# Patient Record
Sex: Female | Born: 1995 | Race: Black or African American | Hispanic: No | Marital: Single | Smoking: Current every day smoker
Health system: Southern US, Community
[De-identification: ages and names within clinical notes are randomized; demographics above are authoritative.]

## PROBLEM LIST (undated history)

## (undated) ENCOUNTER — Inpatient Hospital Stay (HOSPITAL_COMMUNITY): Payer: Self-pay

## (undated) ENCOUNTER — Emergency Department (HOSPITAL_BASED_OUTPATIENT_CLINIC_OR_DEPARTMENT_OTHER): Admission: EM | Payer: Medicaid Other | Source: Home / Self Care

---
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
## 2018-01-15 ENCOUNTER — Encounter (HOSPITAL_COMMUNITY): Payer: Self-pay | Admitting: *Deleted

## 2018-01-15 ENCOUNTER — Inpatient Hospital Stay (HOSPITAL_COMMUNITY)
Admission: AD | Admit: 2018-01-15 | Discharge: 2018-01-16 | Disposition: A | Discharge status: Home / Self Care | Payer: BLUE CROSS/BLUE SHIELD | Source: Ambulatory Visit | Attending: Obstetrics & Gynecology | Admitting: Obstetrics & Gynecology

## 2018-01-15 DIAGNOSIS — N926 Irregular menstruation, unspecified: Secondary | ICD-10-CM | POA: Diagnosis not present

## 2018-01-15 DIAGNOSIS — N912 Amenorrhea, unspecified: Secondary | ICD-10-CM | POA: Insufficient documentation

## 2018-01-15 DIAGNOSIS — Z3202 Encounter for pregnancy test, result negative: Secondary | ICD-10-CM | POA: Insufficient documentation

## 2018-01-15 DIAGNOSIS — R109 Unspecified abdominal pain: Secondary | ICD-10-CM | POA: Diagnosis not present

## 2018-01-15 DIAGNOSIS — O26891 Other specified pregnancy related conditions, first trimester: Secondary | ICD-10-CM | POA: Diagnosis not present

## 2018-01-15 DIAGNOSIS — R112 Nausea with vomiting, unspecified: Secondary | ICD-10-CM

## 2018-01-15 DIAGNOSIS — R102 Pelvic and perineal pain: Secondary | ICD-10-CM

## 2018-01-15 DIAGNOSIS — O26899 Other specified pregnancy related conditions, unspecified trimester: Secondary | ICD-10-CM

## 2018-01-15 LAB — URINALYSIS, ROUTINE W REFLEX MICROSCOPIC
Bilirubin Urine: NEGATIVE
Glucose, UA: NEGATIVE mg/dL
Hgb urine dipstick: NEGATIVE
Ketones, ur: 5 mg/dL — AB
Leukocytes, UA: NEGATIVE
Nitrite: NEGATIVE
Protein, ur: NEGATIVE mg/dL
Specific Gravity, Urine: 1.03 (ref 1.005–1.030)
pH: 6 (ref 5.0–8.0)

## 2018-01-15 LAB — CBC
HCT: 42.7 % (ref 36.0–46.0)
Hemoglobin: 14.6 g/dL (ref 12.0–15.0)
MCH: 29.7 pg (ref 26.0–34.0)
MCHC: 34.2 g/dL (ref 30.0–36.0)
MCV: 86.8 fL (ref 78.0–100.0)
Platelets: 273 10*3/uL (ref 150–400)
RBC: 4.92 MIL/uL (ref 3.87–5.11)
RDW: 14.6 % (ref 11.5–15.5)
WBC: 8.4 10*3/uL (ref 4.0–10.5)

## 2018-01-15 LAB — POCT PREGNANCY, URINE: Preg Test, Ur: POSITIVE — AB

## 2018-01-15 NOTE — MAU Note (Signed)
PT SAYS SHE HAD VAG BLEEDING 2 WEEKS AGO.     BEFORE THIS  NO CYCLE X3-4 MTHS.  HPT WERE NEG. - NO MEDS.     NO BLEEDING NOW- FEELS HEAVY CRAMPS. - NO  HPT.      NO BIRTH CONTROL.   LAST SEX-   3-2

## 2018-01-16 ENCOUNTER — Encounter: Payer: Self-pay | Admitting: Advanced Practice Midwife

## 2018-01-16 ENCOUNTER — Inpatient Hospital Stay (HOSPITAL_COMMUNITY): Payer: BLUE CROSS/BLUE SHIELD

## 2018-01-16 ENCOUNTER — Telehealth (HOSPITAL_COMMUNITY): Payer: Self-pay | Admitting: *Deleted

## 2018-01-16 DIAGNOSIS — O26891 Other specified pregnancy related conditions, first trimester: Secondary | ICD-10-CM

## 2018-01-16 DIAGNOSIS — R109 Unspecified abdominal pain: Secondary | ICD-10-CM

## 2018-01-16 LAB — WET PREP, GENITAL
CLUE CELLS WET PREP: NONE SEEN
Sperm: NONE SEEN
Trich, Wet Prep: NONE SEEN
Yeast Wet Prep HPF POC: NONE SEEN

## 2018-01-16 LAB — HCG, QUANTITATIVE, PREGNANCY

## 2018-01-16 LAB — HIV ANTIBODY (ROUTINE TESTING W REFLEX): HIV SCREEN 4TH GENERATION: NONREACTIVE

## 2018-01-16 MED ORDER — ONDANSETRON 4 MG PO TBDP: 4.0000 mg | ORAL_TABLET | Freq: Three times a day (TID) | ORAL | Status: DC | PRN

## 2018-01-16 NOTE — Discharge Instructions (Signed)
Nausea and Vomiting, Adult Feeling sick to your stomach (nausea) means that your stomach is upset or you feel like you have to throw up (vomit). Feeling more and more sick to your stomach can lead to throwing up. Throwing up happens when food and liquid from your stomach are thrown up and out the mouth. Throwing up can make you feel weak and cause you to get dehydrated. Dehydration can make you tired and thirsty, make you have a dry mouth, and make it so you pee (urinate) less often. Older adults and people with other diseases or a weak defense system (immune system) are at higher risk for dehydration. If you feel sick to your stomach or if you throw up, it is important to follow instructions from your doctor about how to take care of yourself. Follow these instructions at home: Eating and drinking Follow these instructions as told by your doctor:  Take an oral rehydration solution (ORS). This is a drink that is sold at pharmacies and stores.  Drink clear fluids in small amounts as you are able, such as: ? Water. ? Ice chips. ? Diluted fruit juice. ? Low-calorie sports drinks.  Eat bland, easy-to-digest foods in small amounts as you are able, such as: ? Bananas. ? Applesauce. ? Rice. ? Low-fat (lean) meats. ? Toast. ? Crackers.  Avoid fluids that have a lot of sugar or caffeine in them.  Avoid alcohol.  Avoid spicy or fatty foods.  General instructions  Drink enough fluid to keep your pee (urine) clear or pale yellow.  Wash your hands often. If you cannot use soap and water, use hand sanitizer.  Make sure that all people in your home wash their hands well and often.  Take over-the-counter and prescription medicines only as told by your doctor.  Rest at home while you get better.  Watch your condition for any changes.  Breathe slowly and deeply when you feel sick to your stomach.  Keep all follow-up visits as told by your doctor. This is important. Contact a doctor  if:  You have a fever.  You cannot keep fluids down.  Your symptoms get worse.  You have new symptoms.  You feel sick to your stomach for more than two days.  You feel light-headed or dizzy.  You have a headache.  You have muscle cramps. Get help right away if:  You have pain in your chest, neck, arm, or jaw.  You feel very weak or you pass out (faint).  You throw up again and again.  You see blood in your throw-up.  Your throw-up looks like black coffee grounds.  You have bloody or black poop (stools) or poop that look like tar.  You have a very bad headache, a stiff neck, or both.  You have a rash.  You have very bad pain, cramping, or bloating in your belly (abdomen).  You have trouble breathing.  You are breathing very quickly.  Your heart is beating very quickly.  Your skin feels cold and clammy.  You feel confused.  You have pain when you pee.  You have signs of dehydration, such as: ? Dark pee, hardly any pee, or no pee. ? Cracked lips. ? Dry mouth. ? Sunken eyes. ? Sleepiness. ? Weakness. These symptoms may be an emergency. Do not wait to see if the symptoms will go away. Get medical help right away. Call your local emergency services (911 in the [place] Do not drive yourself to the hospital. This information is  not intended to replace advice given to you by your health care provider. Make sure you discuss any questions you have with your health care provider. Document Released: 04/15/2008 Document Revised: 05/17/2016 Document Reviewed: 07/04/2015 Elsevier Interactive Patient Education  2018 [employer] Contraception Choices Contraception, also called birth control, means things to use or ways to try not to get pregnant. Hormonal birth control This kind of birth control uses hormones. Here are some types of hormonal birth control:  A tube that is put under skin of the arm (implant). The tube can stay in for as long as 3 years.  Shots to get  every 3 months (injections).  Pills to take every day (birth control pills).  A patch to change 1 time each week for 3 weeks (birth control patch). After that, the patch is taken off for 1 week.  A ring to put in the vagina. The ring is left in for 3 weeks. Then it is taken out of the vagina for 1 week. Then a new ring is put in.  Pills to take after unprotected sex (emergency birth control pills).  Barrier birth control Here are some types of barrier birth control:  A thin covering that is put on the penis before sex (female condom). The covering is thrown away after sex.  A soft, loose covering that is put in the vagina before sex (female condom). The covering is thrown away after sex.  A rubber bowl that sits over the cervix (diaphragm). The bowl must be made for you. The bowl is put into the vagina before sex. The bowl is left in for 6-8 hours after sex. It is taken out within 24 hours.  A small, soft cup that fits over the cervix (cervical cap). The cup must be made for you. The cup can be left in for 6-8 hours after sex. It is taken out within 48 hours.  A sponge that is put into the vagina before sex. It must be left in for at least 6 hours after sex. It must be taken out within 30 hours. Then it is thrown away.  A chemical that kills or stops sperm from getting into the uterus (spermicide). It may be a pill, cream, jelly, or foam to put in the vagina. The chemical should be used at least 10-15 minutes before sex.  IUD (intrauterine) birth control An IUD is a small, T-shaped piece of plastic. It is put inside the uterus. There are two kinds:  Hormone IUD. This kind can stay in for 3-5 years.  Copper IUD. This kind can stay in for 10 years.  Permanent birth control Here are some types of permanent birth control:  Surgery to block the fallopian tubes.  Having an insert put into each fallopian tube.  Surgery to tie off the tubes that carry sperm (vasectomy).  Natural  planning birth control Here are some types of natural planning birth control:  Not having sex on the days the woman could get pregnant.  Using a calendar: ? To keep track of the length of each period. ? To find out what days pregnancy can happen. ? To plan to not have sex on days when pregnancy can happen.  Watching for symptoms of ovulation and not having sex during ovulation. One way the woman can check for ovulation is to check her temperature.  Waiting to have sex until after ovulation.  Summary  Contraception, also called birth control, means things to use or ways to try not to  get pregnant.  Hormonal methods of birth control include implants, injections, pills, patches, vaginal rings, and emergency birth control pills.  Barrier methods of birth control can include female condoms, female condoms, diaphragms, cervical caps, sponges, and spermicides.  There are two types of IUD (intrauterine device) birth control. An IUD can be put in a woman's uterus to prevent pregnancy for 3-5 years.  Permanent sterilization can be done through a procedure for males, females, or both.  Natural planning methods involve not having sex on the days when the woman could get pregnant. This information is not intended to replace advice given to you by your health care provider. Make sure you discuss any questions you have with your health care provider. Document Released: 08/25/2009 Document Revised: 11/07/2016 Document Reviewed: 11/07/2016 Elsevier Interactive Patient Education  2017 [employer]. Safe Sex Practicing safe sex means taking steps before and during sex to reduce your risk of:  Getting an STD (sexually transmitted disease).  Giving your partner an STD.  Unwanted pregnancy.  How can I practice safe sex?  To practice safe sex:  Limit your sexual partners to only one partner who is having sex with only you.  Avoid using alcohol and recreational drugs before having sex. These  substances can affect your judgment.  Before having sex with a new partner: ? Talk to your partner about past partners, past STDs, and drug use. ? You and your partner should be screened for STDs and discuss the results with each other.  Check your body regularly for sores, blisters, rashes, or unusual discharge. If you notice any of these problems, visit your health care provider.  If you have symptoms of an infection or you are being treated for an STD, avoid sexual contact.  While having sex, use a condom. Make sure to: ? Use a condom every time you have vaginal, oral, or anal sex. Both females and males should wear condoms during oral sex. ? Keep condoms in place from the beginning to the end of sexual activity. ? Use a latex condom, if possible. Latex condoms offer the best protection. ? Use only water-based lubricants or oils to lubricate a condom. Using petroleum-based lubricants or oils will weaken the condom and increase the chance that it will break.  See your health care provider for regular screenings, exams, and tests for STDs.  Talk with your health care provider about the form of birth control (contraception) that is best for you.  Get vaccinated against hepatitis B and human papillomavirus (HPV).  If you are at risk of being infected with HIV (human immunodeficiency virus), talk with your health care provider about taking a prescription medicine to prevent HIV infection. You are considered at risk for HIV if: ? You are a man who has sex with other men. ? You are a heterosexual man or woman who is sexually active with more than one partner. ? You take drugs by injection. ? You are sexually active with a partner who has HIV.  This information is not intended to replace advice given to you by your health care provider. Make sure you discuss any questions you have with your health care provider. Document Released: 12/05/2004 Document Revised: 03/13/2016 Document Reviewed:  09/17/2015 Elsevier Interactive Patient Education  2018 [employer].  Prenatal Care Providers [facility]    [facility]  [phone]     Phone: [phone]          [facility]  [facility]  [email or web address]  [employer]: [phone]  Phone: [phone]         [facility]     Phone: [phone]  Phone: [phone]           [facility] [place]                hone: [phone]  Phone: [phone]         [facility] [address]            Phone: [phone]  Phone:          [facility] Dept[facility]                Phone: [phone]  [facility]   [address] [place])          Phone: [phone] [facility]   Phone: [phone]

## 2018-01-16 NOTE — MAU Provider Note (Signed)
Chief Complaint: Abdominal Pain   First Provider Initiated Contact with Patient 01/16/18 0007        SUBJECTIVE HPI: Laura Lynch is a 22 y.o. G0P0000 who presents to maternity admissions reporting pelvic cramping and irregular bleeding.  Bled 2 weeks ago which was after missing period for 2 months.  States only had sex one time 2 weeks ago.. She denies vaginal bleeding, vaginal itching/burning, urinary symptoms, h/a, dizziness, n/v, or fever/chills.    Abdominal Pain  This is a new problem. The current episode started in the past 7 days. The onset quality is gradual. The problem occurs intermittently. The problem has been unchanged. The pain is located in the suprapubic region, LLQ and RLQ. The pain is mild. The quality of the pain is cramping. The abdominal pain does not radiate. Pertinent negatives include no constipation, diarrhea, dysuria or fever. Nothing aggravates the pain. The pain is relieved by nothing. She has tried nothing for the symptoms.    RN Note: PT SAYS SHE HAD VAG BLEEDING 2 WEEKS AGO.     BEFORE THIS  NO CYCLE X3-4 MTHS.  HPT WERE NEG. - NO MEDS.     NO BLEEDING NOW- FEELS HEAVY CRAMPS. - NO  HPT.      NO BIRTH CONTROL.   LAST SEX-   3-2    Social History   Socioeconomic History  . Marital status: Single    Spouse name: Not on file  . Number of children: Not on file  . Years of education: Not on file  . Highest education level: Not on file  Social Needs  . Financial resource strain: Not on file  . Food insecurity - worry: Not on file  . Food insecurity - inability: Not on file  . Transportation needs - medical: Not on file  . Transportation needs - non-medical: Not on file  Occupational History  . Not on file  Tobacco Use  . Smoking status: Not on file  Substance and Sexual Activity  . Alcohol use: Not on file  . Drug use: Not on file  . Sexual activity: Not on file  Other Topics Concern  . Not on file  Social History Narrative  . Not on file   No  current facility-administered medications on file prior to encounter.    No current outpatient medications on file prior to encounter.   Allergies not on file  I have reviewed patient's Past Medical Hx, Surgical Hx, Family Hx, Social Hx, medications and allergies.   ROS:  Review of Systems  Constitutional: Negative for fever.  Gastrointestinal: Positive for abdominal pain. Negative for constipation and diarrhea.  Genitourinary: Negative for dysuria.   Review of Systems  Other systems negative   Physical Exam  Physical Exam Patient Vitals for the past 24 hrs:  BP Temp Temp src Pulse Resp Height Weight  01/15/18 2214 128/82 98.8 F (37.1 C) Oral 85 20 5\' 6"  (1.676 m) 258 lb (117 kg)   Constitutional: Well-developed, well-nourished female in no acute distress.  Cardiovascular: normal rate Respiratory: normal effort GI: Abd soft, non-tender. Pos BS x 4 MS: Extremities nontender, no edema, normal ROM Neurologic: Alert and oriented x 4.  GU: Neg CVAT.  PELVIC EXAM: Minimal white discharge. NO bleeding.  Self-collected swabs.  Declined pelvic exam after finding out pregnancy test was negative.   LAB RESULTS Results for orders placed or performed during the hospital encounter of 01/15/18 (from the past 24 hour(s))  Urinalysis, Routine w reflex microscopic  Status: Abnormal   Collection Time: 01/15/18 10:17 PM  Result Value Ref Range   Color, Urine YELLOW YELLOW   APPearance CLEAR CLEAR   Specific Gravity, Urine 1.030 1.005 - 1.030   pH 6.0 5.0 - 8.0   Glucose, UA NEGATIVE NEGATIVE mg/dL   Hgb urine dipstick NEGATIVE NEGATIVE   Bilirubin Urine NEGATIVE NEGATIVE   Ketones, ur 5 (A) NEGATIVE mg/dL   Protein, ur NEGATIVE NEGATIVE mg/dL   Nitrite NEGATIVE NEGATIVE   Leukocytes, UA NEGATIVE NEGATIVE  Pregnancy, urine POC     Status: Abnormal   Collection Time: 01/15/18 10:26 PM  Result Value Ref Range   Preg Test, Ur POSITIVE (A) NEGATIVE  CBC     Status: None    Collection Time: 01/15/18 11:12 PM  Result Value Ref Range   WBC 8.4 4.0 - 10.5 K/uL   RBC 4.92 3.87 - 5.11 MIL/uL   Hemoglobin 14.6 12.0 - 15.0 g/dL   HCT 16.1 09.6 - 04.5 %   MCV 86.8 78.0 - 100.0 fL   MCH 29.7 26.0 - 34.0 pg   MCHC 34.2 30.0 - 36.0 g/dL   RDW 40.9 81.1 - 91.4 %   Platelets 273 150 - 400 K/uL    Ref. Range 01/15/2018 23:12  HCG, Beta Chain, Quant, S Latest Ref Range: <5 mIU/mL <1      IMAGING No results found.  MAU Management/MDM: Ordered usual first trimester r/o ectopic labs.   Pelvic exam and cultures done Quant HCG is negative, despite positive urine pregnancy test  Patient was inconsolable when told UPT was positive Ecstatic that blood test is negative.  Long discussion of need for contraception  Does not have a GYN, list provided    ASSESSMENT Irregular menstrual bleeding pattern Unexplained amenorrhea Negative pregnancy test  PLAN Discharge home Followup with GYN for contraceptive counseling.  Pt stable at time of discharge. Encouraged to return here or to other Urgent Care/ED if she develops worsening of symptoms, increase in pain, fever, or other concerning symptoms.    Wynelle Bourgeois CNM, MSN Certified Nurse-Midwife 01/16/2018  12:07 AM

## 2018-01-17 LAB — GC/CHLAMYDIA PROBE AMP (~~LOC~~) NOT AT ARMC
Chlamydia: NEGATIVE
Neisseria Gonorrhea: NEGATIVE

## 2018-04-30 ENCOUNTER — Ambulatory Visit: Payer: Self-pay | Admitting: Clinical

## 2018-04-30 ENCOUNTER — Ambulatory Visit (INDEPENDENT_AMBULATORY_CARE_PROVIDER_SITE_OTHER): Payer: BLUE CROSS/BLUE SHIELD | Admitting: Obstetrics & Gynecology

## 2018-04-30 ENCOUNTER — Encounter: Payer: Self-pay | Admitting: Obstetrics & Gynecology

## 2018-04-30 VITALS — BP 139/67 | HR 56 | Ht 67.0 in | Wt 237.0 lb

## 2018-04-30 DIAGNOSIS — Z124 Encounter for screening for malignant neoplasm of cervix: Secondary | ICD-10-CM

## 2018-04-30 DIAGNOSIS — Z113 Encounter for screening for infections with a predominantly sexual mode of transmission: Secondary | ICD-10-CM | POA: Diagnosis not present

## 2018-04-30 DIAGNOSIS — N938 Other specified abnormal uterine and vaginal bleeding: Secondary | ICD-10-CM | POA: Diagnosis not present

## 2018-04-30 DIAGNOSIS — Z0000 Encounter for general adult medical examination without abnormal findings: Secondary | ICD-10-CM

## 2018-04-30 DIAGNOSIS — F419 Anxiety disorder, unspecified: Secondary | ICD-10-CM

## 2018-04-30 DIAGNOSIS — F4322 Adjustment disorder with anxiety: Secondary | ICD-10-CM

## 2018-04-30 MED ORDER — MEGESTROL ACETATE 40 MG PO TABS: 40.0000 mg | ORAL_TABLET | Freq: Two times a day (BID) | ORAL | 5 refills | Status: DC

## 2018-04-30 MED ORDER — NORGESTREL-ETHINYL ESTRADIOL 0.3-30 MG-MCG PO TABS: 1.0000 | ORAL_TABLET | Freq: Every day | ORAL | 11 refills | Status: AC

## 2018-04-30 NOTE — Progress Notes (Signed)
   Subjective:    Patient ID: Laura Lynch, female    DOB: 04-29-1996, 22 y.o.   MRN: [id number]  HPI  22 yo G0 here with 3 months of irregular bleeding. She was seen in the MAU 3/19 with this problem. A UPT was + so an u/s was done looking for a pregnancy. There was none. Her QBHCG was negative. She does not use contraception. She is "talking to" a fellow now, but hasn't sex for 4 months.  Review of Systems Her hbg was 14 in March 2019.    Objective:   Physical Exam Breathing, conversing, and ambulating normally Well nourished, well hydrated Black female, no apparent distress Abd- benign, morbidly obese Normal appearing cervix     Assessment & Plan:  DUB- check gyn u/s, TSH Start Lo ovral today Use back up method for 2 weeks Preventative care- pap smear with cotesting done today BP check in 2 weeks She will see Laura Lynch today Follow up 3 months

## 2018-04-30 NOTE — BH Specialist Note (Signed)
Integrated Behavioral Health Initial Visit  MRN: [id number] Name: Laura BruntKeera E Zentz  Number of Integrated Behavioral Health Clinician visits:: 1/6 Session Start time: 4:00  Session End time: 4:15 Total time: 15 minutes  Type of Service: Integrated Behavioral Health- Individual/Family Interpretor:No. Interpretor Name and Language: n/a   Warm Hand Off Completed.       SUBJECTIVE: Laura Lynch is a 22 y.o. female accompanied by n/a Patient was referred by Dr Marice Potterove for anxiety and depression. Patient reports the following symptoms/concerns: Pt states her primary concern today is fatigue, lack of appetite, restlessness, irritability, dread, and lack of quality sleep attributed to life stress ; pt open to self-coping strategies and education today, for herself and friends.  Duration of problem: Three months; Severity of problem: moderate  OBJECTIVE: Mood: Anxious and Affect: Appropriate and Tearful Risk of harm to self or others: No plan to harm self or others  LIFE CONTEXT: Family and Social: Pt lives by herself; supportive friends School/Work: - Self-Care: Recognizing greater need for self-care post-breakup Life Changes: Excessive bleeding; breakup of romantic relationship   GOALS ADDRESSED: Patient will: 1. Reduce symptoms of: agitation, anxiety, depression, insomnia and stress 2. Increase knowledge and/or ability of: self-management skills  3. Demonstrate ability to: Increase healthy adjustment to current life circumstances  INTERVENTIONS: Interventions utilized: Mindfulness or Management consultantelaxation Training, Sleep Hygiene and Psychoeducation and/or Health Education  Standardized Assessments completed: GAD-7 and PHQ 9  ASSESSMENT: Patient currently experiencing Adjustment disorder with anxious mood.   Patient may benefit from psychoeducation and brief therapeutic interventions regarding coping with symptoms of anxiety .  PLAN: 1. Follow up with behavioral health clinician on : As  needed 2. Behavioral recommendations:  -CALM relaxation breathing exercise every morning and at bedtime; increase calming sleep sounds to every night, for as long as remains helpful -Read educational materials regarding coping with symptoms of depression and anxiety; share materials with friends experiencing symptoms  -Share community mental health resources with friends  3. Referral(s): [facility] 4. "From scale of 1-10, how likely are you to follow plan?": 10  Rae LipsJamie C Lunden Stieber, LCSW  Dep[facility] screen [facility] 04/30/2018  Decreased Interest 0  Down, Depressed, Hopeless 0  PHQ - 2 Score 0  Altered sleeping 3  Tired, decreased energy 2  Change in appetite 3  Feeling bad or failure about yourself  1  Trouble concentrating 3  Moving slowly or fidgety/restless 3  Suicidal thoughts 0  PHQ-9 Score 15   GAD 7 : Generalized Anxiety Score 04/30/2018  Nervous, Anxious, on Edge 0  Control/stop worrying 1  Worry too much - different things 0  Trouble relaxing 3  Restless 3  Easily annoyed or irritable 3  Afraid - awful might happen 3  Total GAD 7 Score 13

## 2018-04-30 NOTE — Progress Notes (Signed)
Patient had elevated phq9 & gad7 today- will see Asher MuirJamie. Dr Marice Potterove aware.

## 2018-05-01 LAB — TSH: TSH: 1.51 u[IU]/mL (ref 0.450–4.500)

## 2018-05-01 LAB — CYTOLOGY - PAP
Chlamydia: NEGATIVE
Diagnosis: NEGATIVE
Neisseria Gonorrhea: NEGATIVE

## 2018-05-29 ENCOUNTER — Encounter: Payer: Self-pay | Admitting: Advanced Practice Midwife

## 2018-05-29 ENCOUNTER — Ambulatory Visit (INDEPENDENT_AMBULATORY_CARE_PROVIDER_SITE_OTHER): Payer: BLUE CROSS/BLUE SHIELD | Admitting: Advanced Practice Midwife

## 2018-05-29 ENCOUNTER — Emergency Department (HOSPITAL_COMMUNITY)
Admission: EM | Admit: 2018-05-29 | Discharge: 2018-05-29 | Disposition: A | Discharge status: Home / Self Care | Payer: BLUE CROSS/BLUE SHIELD | Attending: Emergency Medicine | Admitting: Emergency Medicine

## 2018-05-29 ENCOUNTER — Ambulatory Visit: Payer: BLUE CROSS/BLUE SHIELD | Admitting: Obstetrics & Gynecology

## 2018-05-29 ENCOUNTER — Other Ambulatory Visit: Payer: Self-pay

## 2018-05-29 VITALS — BP 126/70 | HR 80

## 2018-05-29 DIAGNOSIS — Z79899 Other long term (current) drug therapy: Secondary | ICD-10-CM | POA: Insufficient documentation

## 2018-05-29 DIAGNOSIS — F17200 Nicotine dependence, unspecified, uncomplicated: Secondary | ICD-10-CM | POA: Insufficient documentation

## 2018-05-29 DIAGNOSIS — R109 Unspecified abdominal pain: Secondary | ICD-10-CM | POA: Insufficient documentation

## 2018-05-29 DIAGNOSIS — Z113 Encounter for screening for infections with a predominantly sexual mode of transmission: Secondary | ICD-10-CM | POA: Diagnosis not present

## 2018-05-29 DIAGNOSIS — N938 Other specified abnormal uterine and vaginal bleeding: Secondary | ICD-10-CM | POA: Diagnosis not present

## 2018-05-29 DIAGNOSIS — F1210 Cannabis abuse, uncomplicated: Secondary | ICD-10-CM | POA: Diagnosis not present

## 2018-05-29 DIAGNOSIS — N921 Excessive and frequent menstruation with irregular cycle: Secondary | ICD-10-CM | POA: Diagnosis not present

## 2018-05-29 DIAGNOSIS — N939 Abnormal uterine and vaginal bleeding, unspecified: Secondary | ICD-10-CM | POA: Diagnosis present

## 2018-05-29 LAB — CBC
HCT: 41.5 % (ref 36.0–46.0)
Hemoglobin: 14.3 g/dL (ref 12.0–15.0)
MCH: 30.7 pg (ref 26.0–34.0)
MCHC: 34.5 g/dL (ref 30.0–36.0)
MCV: 89.1 fL (ref 78.0–100.0)
PLATELETS: 313 10*3/uL (ref 150–400)
RBC: 4.66 MIL/uL (ref 3.87–5.11)
RDW: 13.8 % (ref 11.5–15.5)
WBC: 9.1 10*3/uL (ref 4.0–10.5)

## 2018-05-29 LAB — COMPREHENSIVE METABOLIC PANEL
ALT: 15 U/L (ref 0–44)
AST: 13 U/L — ABNORMAL LOW (ref 15–41)
Albumin: 3.8 g/dL (ref 3.5–5.0)
Alkaline Phosphatase: 66 U/L (ref 38–126)
Anion gap: 9 (ref 5–15)
BUN: 11 mg/dL (ref 6–20)
CHLORIDE: 108 mmol/L (ref 98–111)
CO2: 22 mmol/L (ref 22–32)
CREATININE: 0.85 mg/dL (ref 0.44–1.00)
Calcium: 9 mg/dL (ref 8.9–10.3)
Glucose, Bld: 121 mg/dL — ABNORMAL HIGH (ref 70–99)
POTASSIUM: 3.7 mmol/L (ref 3.5–5.1)
SODIUM: 139 mmol/L (ref 135–145)
Total Bilirubin: 0.3 mg/dL (ref 0.3–1.2)
Total Protein: 7.5 g/dL (ref 6.5–8.1)

## 2018-05-29 LAB — URINALYSIS, ROUTINE W REFLEX MICROSCOPIC
Bilirubin Urine: NEGATIVE
GLUCOSE, UA: NEGATIVE mg/dL
Ketones, ur: 5 mg/dL — AB
Leukocytes, UA: NEGATIVE
Nitrite: NEGATIVE
Protein, ur: NEGATIVE mg/dL
SPECIFIC GRAVITY, URINE: 1.024 (ref 1.005–1.030)
pH: 5 (ref 5.0–8.0)

## 2018-05-29 LAB — POC URINE PREG, ED: PREG TEST UR: NEGATIVE

## 2018-05-29 LAB — I-STAT BETA HCG BLOOD, ED (MC, WL, AP ONLY): I-stat hCG, quantitative: <5 m[IU]/mL (ref <5)

## 2018-05-29 LAB — LIPASE, BLOOD: LIPASE: 26 U/L (ref 11–51)

## 2018-05-29 NOTE — Progress Notes (Signed)
Per Dr. Dove, pt does not need to be contacted in regards to missed appt.    

## 2018-05-29 NOTE — ED Triage Notes (Signed)
Per Pt: Pt reports she is having severe stabbing abdominal pain that has been going on for 3 days. Pt reports heavy bleeding through her tampons with lots of clots. Pt reports she woke up at 5am due to the pain.  Pt denies a hx of fibroids, but her mom has fibroids.

## 2018-05-29 NOTE — Discharge Instructions (Signed)
Go to womens clinic now

## 2018-05-29 NOTE — ED Provider Notes (Signed)
[facility]-EMERGENCY DEPT Provider Note   CSN: [id number] Arrival date & time: 05/29/18  1319     History   Chief Complaint Chief Complaint  Patient presents with  . Abdominal Pain  . Vaginal Bleeding    HPI Laura Lynch is a 22 y.o. female.  HPI Patient is a 22 year old female presents the emergency department for ongoing vaginal bleeding and history of dysfunctional uterine bleeding over the past 3 months.  She was recently started on a course of contraceptive medication by her GYN clinic and she reports this is not helped her vaginal bleeding.  She presents with ongoing crampy abdominal pain and persistent heavy vaginal bleeding without lightheadedness or syncope.  She was supposed to follow-up in the gynecology clinic this morning but she overslept.  Her appointment was at 830 this morning.  She is not sexually active.  Patient has not had an ultrasound at this point.  No past medical history on file.  Patient Active Problem List   Diagnosis Date Noted  . Morbid obesity (HCC) 04/30/2018  . DUB (dysfunctional uterine bleeding) 04/30/2018    ** The histories are not reviewed yet. Please review them in the "History" navigator section and refresh this SmartLink.   OB History    Gravida  1   Para  0   Term  0   Preterm  0   AB  0   Living  0     SAB  0   TAB  0   Ectopic  0   Multiple  0   Live Births  0            Home Medications    Prior to Admission medications   Medication Sig Start Date End Date Taking? Authorizing Provider  megestrol (MEGACE) 40 MG tablet Take 40 mg by mouth 2 (two) times daily. 05/01/18  Yes [provider]  norgestrel-ethinyl estradiol (LO/OVRAL,CRYSELLE) 0.3-30 MG-MCG tablet Take 1 tablet by mouth daily. Patient not taking: Reported on 05/29/2018 04/30/18   Allie Bossierove, Myra C, MD    Family History No family history on file.  Social History Social History   Tobacco Use  . Smoking status:  Current Every Day Smoker  . Smokeless tobacco: Never Used  Substance Use Topics  . Alcohol use: Yes  . Drug use: Yes    Types: Marijuana     Allergies   Patient has no known allergies.   Review of Systems Review of Systems  All other systems reviewed and are negative.    Physical Exam Updated Vital Signs BP (!) 141/92   Pulse 65   Temp 98.3 F (36.8 C) (Oral)   Resp 18   Ht 5\' 6"  (1.676 m)   Wt 104.5 kg (230 lb 7 oz)   LMP 05/29/2018   SpO2 99%   BMI 37.19 kg/m   Physical Exam  Constitutional: She is oriented to person, place, and time. She appears well-developed and well-nourished.  HENT:  Head: Normocephalic.  Eyes: EOM are normal.  Neck: Normal range of motion.  Pulmonary/Chest: Effort normal.  Abdominal: Soft. She exhibits no mass. There is no tenderness.  Genitourinary:  Genitourinary Comments: GU examination deferred  Musculoskeletal: Normal range of motion.  Neurological: She is alert and oriented to person, place, and time.  Psychiatric: She has a normal mood and affect.  Nursing note and vitals reviewed.    ED Treatments / Results  Labs (all labs ordered are listed, but only abnormal results are  displayed) Labs Reviewed  CBC  LIPASE, BLOOD  COMPREHENSIVE METABOLIC PANEL  URINALYSIS, ROUTINE W REFLEX MICROSCOPIC  I-STAT BETA HCG BLOOD, ED ([facility], [facility], [facility] ONLY)  POC URINE PREG, ED    EKG None  Radiology No results found.  Procedures Procedures (including critical care time)  Medications Ordered in ED Medications - No data to display   Initial Impression / Assessment and Plan / ED Course  I have reviewed the triage vital signs and the nursing notes.  Pertinent labs & imaging results that were available during my care of the patient were reviewed by me and considered in my medical decision making (see chart for details).     Well-appearing.  Hemoglobin 14.  Pregnancy test is negative.  Vital signs are stable.  I contacted the  gynecology clinic and they stated that they will see her in clinic right now.  Patient is ambulatory without lightheadedness.  She is stable to drive herself to clinic right now.  She is being discharged at this time  Final Clinical Impressions(s) / ED Diagnoses   Final diagnoses:  DUB (dysfunctional uterine bleeding)    ED Discharge Orders    None       Azalia Bilis, MD 05/29/18 1450

## 2018-05-29 NOTE — ED Notes (Signed)
Pt's urine collected with a culture sample at this time.

## 2018-05-29 NOTE — Progress Notes (Signed)
GYNECOLOGY ANNUAL PREVENTATIVE CARE ENCOUNTER NOTE  Subjective:   Laura Lynch is a 22 y.o. [id number] female here for heavy vaginal bleeding. Patient missed her GYN appointment in clinic this morning, reported to [facility] ED for evaluation, then [facility] contacted me to see if she could be seen in clinic today.   Negative pregnancy test today. Reports intermittent bilateral low abdominal menstrual cramps 7/10. Does not radiate. Has not tried medication, unable to identify aggravating or alleviating factors.  Most recent intercourse two months ago   Was given OCP by Dr. Marice Potter 04/30/18. Stopped taking after first month because "they didn't help my bleeding". Gynecologic History Patient's last menstrual period was 05/29/2018. Contraception: none Last Pap: 04/30/18. Results were: normal with negative HPV Last mammogram: N/A age 50.   Obstetric History OB History  Gravida Para Term Preterm AB Living  1 0 0 0 0 0  SAB TAB Ectopic Multiple Live Births  0 0 0 0 0    # Outcome Date GA Lbr Len/2nd Weight Sex Delivery Anes PTL Lv  1 Gravida               Current Outpatient Medications on File Prior to Visit  Medication Sig Dispense Refill  . megestrol (MEGACE) 40 MG tablet Take 40 mg by mouth 2 (two) times daily.  4  . norgestrel-ethinyl estradiol (LO/OVRAL,CRYSELLE) 0.3-30 MG-MCG tablet Take 1 tablet by mouth daily. (Patient not taking: Reported on 05/29/2018) 1 Package 11   No current facility-administered medications on file prior to visit.     No Known Allergies  Social History:  reports that she has been smoking.  She has never used smokeless tobacco. She reports that she drinks alcohol. She reports that she has current or past drug history. Drug: Marijuana.  No family history on file.  The following portions of the patient's history were reviewed and updated as appropriate: allergies, current medications, past family history, past medical history, past social history, past surgical  history and problem list.  Review of Systems Pertinent items noted in HPI and remainder of comprehensive ROS otherwise negative.   Objective:  BP 126/70   Pulse 80   LMP 05/29/2018  CONSTITUTIONAL: Well-developed, well-nourished female in no acute distress.  HENT:  Normocephalic, atraumatic, External right and left ear normal. Oropharynx is clear and moist EYES: Conjunctivae and EOM are normal. Pupils are equal, round, and reactive to light. No scleral icterus.  NECK: Normal range of motion, supple, no masses.  Normal thyroid.  SKIN: Skin is warm and dry. No rash noted. Not diaphoretic. No erythema. No pallor. MUSCULOSKELETAL: Normal range of motion. No tenderness.  No cyanosis, clubbing, or edema.  2+ distal pulses. NEUROLOGIC: Alert and oriented to person, place, and time. Normal reflexes, muscle tone coordination. No cranial nerve deficit noted. PSYCHIATRIC: Normal mood and affect. Normal behavior. Normal judgment and thought content. CARDIOVASCULAR: Normal heart rate noted, regular rhythm RESPIRATORY: Clear to auscultation bilaterally. Effort and breath sounds normal, no problems with respiration noted. BREASTS: Symmetric in size. No masses, skin changes, nipple drainage, or lymphadenopathy. ABDOMEN: Soft, normal bowel sounds, no distention noted.  No tenderness, rebound or guarding.  PELVIC: Normal appearing external genitalia; normal appearing vaginal mucosa and cervix.  No abnormal discharge noted.  Pap smear obtained.  Normal uterine size, no other palpable masses, no uterine or adnexal tenderness.  Results for orders placed or performed during the hospital encounter of 05/29/18 (from the past 24 hour(s))  Lipase, blood  Status: None   Collection Time: 05/29/18  2:07 PM  Result Value Ref Range   Lipase 26 11 - 51 U/L  Comprehensive metabolic panel     Status: Abnormal   Collection Time: 05/29/18  2:07 PM  Result Value Ref Range   Sodium 139 135 - 145 mmol/L   Potassium 3.7  3.5 - 5.1 mmol/L   Chloride 108 98 - 111 mmol/L   CO2 22 22 - 32 mmol/L   Glucose, Bld 121 (H) 70 - 99 mg/dL   BUN 11 6 - 20 mg/dL   Creatinine, Ser 1.610.85 0.44 - 1.00 mg/dL   Calcium 9.0 8.9 - 09.610.3 mg/dL   Total Protein 7.5 6.5 - 8.1 g/dL   Albumin 3.8 3.5 - 5.0 g/dL   AST 13 (L) 15 - 41 U/L   ALT 15 0 - 44 U/L   Alkaline Phosphatase 66 38 - 126 U/L   Total Bilirubin 0.3 0.3 - 1.2 mg/dL   GFR calc non Af Amer >60 >60 mL/min   GFR calc Af Amer >60 >60 mL/min   Anion gap 9 5 - 15  CBC     Status: None   Collection Time: 05/29/18  2:07 PM  Result Value Ref Range   WBC 9.1 4.0 - 10.5 K/uL   RBC 4.66 3.87 - 5.11 MIL/uL   Hemoglobin 14.3 12.0 - 15.0 g/dL   HCT 04.541.5 40.936.0 - 81.146.0 %   MCV 89.1 78.0 - 100.0 fL   MCH 30.7 26.0 - 34.0 pg   MCHC 34.5 30.0 - 36.0 g/dL   RDW 91.413.8 78.211.5 - 95.615.5 %   Platelets 313 150 - 400 K/uL  I-Stat beta hCG blood, ED     Status: None   Collection Time: 05/29/18  2:14 PM  Result Value Ref Range   I-stat hCG, quantitative <5.0 <5 mIU/mL   Comment 3          Urinalysis, Routine w reflex microscopic     Status: Abnormal   Collection Time: 05/29/18  2:27 PM  Result Value Ref Range   Color, Urine YELLOW YELLOW   APPearance CLEAR CLEAR   Specific Gravity, Urine 1.024 1.005 - 1.030   pH 5.0 5.0 - 8.0   Glucose, UA NEGATIVE NEGATIVE mg/dL   Hgb urine dipstick SMALL (A) NEGATIVE   Bilirubin Urine NEGATIVE NEGATIVE   Ketones, ur 5 (A) NEGATIVE mg/dL   Protein, ur NEGATIVE NEGATIVE mg/dL   Nitrite NEGATIVE NEGATIVE   Leukocytes, UA NEGATIVE NEGATIVE   WBC, UA 0-5 0 - 5 WBC/hpf   Bacteria, UA RARE (A) NONE SEEN   Mucus PRESENT   POC Urine Pregnancy, ED ([facility])     Status: None   Collection Time: 05/29/18  2:31 PM  Result Value Ref Range   Preg Test, Ur NEGATIVE NEGATIVE    Assessment and Plan:  1. Menometrorrhagia --Cervicovaginal ancillary only --Outpatient ultrasound scheduled --Patient to restart OCP as prescribed by Dr. Marice Potterove --Follow  up 3-4 months PRN  Clayton BiblesSamantha Bonney Berres, CNM 05/29/18 5:00 PM

## 2018-05-29 NOTE — Patient Instructions (Signed)

## 2018-05-30 ENCOUNTER — Encounter (HOSPITAL_COMMUNITY): Payer: Self-pay

## 2018-05-30 ENCOUNTER — Emergency Department (HOSPITAL_COMMUNITY)
Admission: EM | Admit: 2018-05-30 | Discharge: 2018-05-30 | Disposition: A | Discharge status: Home / Self Care | Payer: BLUE CROSS/BLUE SHIELD | Attending: Emergency Medicine | Admitting: Emergency Medicine

## 2018-05-30 ENCOUNTER — Other Ambulatory Visit: Payer: Self-pay

## 2018-05-30 DIAGNOSIS — N938 Other specified abnormal uterine and vaginal bleeding: Secondary | ICD-10-CM

## 2018-05-30 DIAGNOSIS — F17200 Nicotine dependence, unspecified, uncomplicated: Secondary | ICD-10-CM | POA: Insufficient documentation

## 2018-05-30 DIAGNOSIS — Z79899 Other long term (current) drug therapy: Secondary | ICD-10-CM | POA: Diagnosis not present

## 2018-05-30 MED ORDER — IBUPROFEN 800 MG PO TABS: 800.0000 mg | ORAL_TABLET | Freq: Three times a day (TID) | ORAL | 0 refills | Status: DC

## 2018-05-30 MED ORDER — IBUPROFEN 800 MG PO TABS
800.0000 mg | ORAL_TABLET | Freq: Once | ORAL | Status: AC
  Administered 2018-05-30: 800 mg via ORAL
  Filled 2018-05-30: qty 1

## 2018-05-30 MED ORDER — ONDANSETRON 4 MG PO TBDP: ORAL_TABLET | ORAL | 0 refills | Status: DC

## 2018-05-30 NOTE — Discharge Instructions (Signed)
1. Medications: zofran, ibuprofen (always with food), usual home medications including the recommended birth control pills as directed 2. Treatment: rest, drink plenty of fluids,  3. Follow Up: Please followup with Dr. Marice Potterove in 1 week for discussion of your diagnoses and further evaluation after today's visit; if you do not have a primary care doctor use the resource guide provided to find one; Please return to the ER for persistent vomiting, high fevers or worsening symptoms

## 2018-05-30 NOTE — ED Triage Notes (Signed)
[facility] and abd pain. Started taking OCP in June but stopped taking after first month because "they didn't help my bleeding".   Was advised to restart OCP.  Has u/s scheduled for 06-03-18.

## 2018-05-30 NOTE — ED Provider Notes (Signed)
[facility] EMERGENCY DEPARTMENT Provider Note   CSN: [id number] Arrival date & time: 05/30/18  2116     History   Chief Complaint Chief Complaint  Patient presents with  . Abdominal Pain  . Vaginal Bleeding    HPI Laura Lynch is a 22 y.o. female with a hx of obesity, dysfunctional uterine bleeding presents to the Emergency Department complaining of gradual, persistent, vaginal bleeding onset 3 months ago. Associated symptoms include the lower abdominal pain.  No aggravating or alleviating factors.  Patient denies lightheadedness or syncope.  She was evaluated by Dr. Marice Potter for this several months ago and started on oral contraceptive pills however stopped taking this because "it did not help."  Pt reports she took hydrocodone this morning without relief, but her pain is low at this time.  Pt denies fever, chills, headache, neck pain, chest pain, shortness of breath, vomiting, diarrhea, weakness, dizziness, syncope, dysuria, hematuria.  Record review shows that patient was seen on 05/29/2018 at [facility] for lower abdominal pain and dysfunctional uterine bleeding.  At that time her hemoglobin was 14 and her pregnancy test was negative.  Her vital signs were stable.  The GYN clinic was contacted and patient was evaluated later that afternoon.  Exam at GYN clinic was without abnormalities.  Outpatient ultrasound was ordered and patient was instructed to restart her oral contraceptive pills as initially prescribed by Dr. Marice Potter.  She was to follow-up as needed.   The history is provided by the patient and medical records. No language interpreter was used.    History reviewed. No pertinent past medical history.  Patient Active Problem List   Diagnosis Date Noted  . Morbid obesity (HCC) 04/30/2018  . DUB (dysfunctional uterine bleeding) 04/30/2018    History reviewed. No pertinent surgical history.   OB History    Gravida  1   Para  0   Term  0   Preterm  0   AB  0   Living  0     SAB  0   TAB  0   Ectopic  0   Multiple  0   Live Births  0            Home Medications    Prior to Admission medications   Medication Sig Start Date End Date Taking? Authorizing Provider  ibuprofen (ADVIL,MOTRIN) 800 MG tablet Take 1 tablet (800 mg total) by mouth 3 (three) times daily. 05/30/18   Ilai Hiller, Dahlia Client, PA-C  megestrol (MEGACE) 40 MG tablet Take 40 mg by mouth 2 (two) times daily. 05/01/18   [provider]  norgestrel-ethinyl estradiol (LO/OVRAL,CRYSELLE) 0.3-30 MG-MCG tablet Take 1 tablet by mouth daily. Patient not taking: Reported on 05/29/2018 04/30/18   Allie Bossier, MD  ondansetron (ZOFRAN ODT) 4 MG disintegrating tablet 4mg  ODT q4 hours prn nausea/vomit 05/30/18   Kalesha Irving, Dahlia Client, PA-C    Family History History reviewed. No pertinent family history.  Social History Social History   Tobacco Use  . Smoking status: Current Every Day Smoker  . Smokeless tobacco: Never Used  Substance Use Topics  . Alcohol use: Yes  . Drug use: Yes    Types: Marijuana     Allergies   Patient has no known allergies.   Review of Systems Review of Systems  Constitutional: Negative for appetite change, diaphoresis, fatigue, fever and unexpected weight change.  HENT: Negative for mouth sores.   Eyes: Negative for visual disturbance.  Respiratory: Negative for cough,  chest tightness, shortness of breath and wheezing.   Cardiovascular: Negative for chest pain.  Gastrointestinal: Positive for abdominal pain. Negative for constipation, diarrhea, nausea and vomiting.  Endocrine: Negative for polydipsia, polyphagia and polyuria.  Genitourinary: Positive for vaginal bleeding. Negative for dysuria, frequency, hematuria and urgency.  Musculoskeletal: Negative for back pain and neck stiffness.  Skin: Negative for rash.  Allergic/Immunologic: Negative for immunocompromised state.  Neurological: Negative for syncope,  light-headedness and headaches.  Hematological: Does not bruise/bleed easily.  Psychiatric/Behavioral: Negative for sleep disturbance. The patient is not nervous/anxious.      Physical Exam Updated Vital Signs BP 128/66   Pulse (!) 56   Temp 98.9 F (37.2 C) (Oral)   Resp 16   LMP 05/29/2018   SpO2 98%   Physical Exam  Constitutional: She appears well-developed and well-nourished. No distress.  Awake, alert, nontoxic appearance  HENT:  Head: Normocephalic and atraumatic.  Mouth/Throat: Oropharynx is clear and moist. No oropharyngeal exudate.  Eyes: Conjunctivae are normal. No scleral icterus.  Neck: Normal range of motion. Neck supple.  Cardiovascular: Normal rate, regular rhythm and intact distal pulses.  Pulmonary/Chest: Effort normal and breath sounds normal. No respiratory distress. She has no wheezes.  Equal chest expansion  Abdominal: Soft. Bowel sounds are normal. She exhibits no mass. There is no tenderness. There is no rebound and no guarding.  Genitourinary:  Genitourinary Comments: Vaginal exam deferred  Musculoskeletal: Normal range of motion. She exhibits no edema.  Neurological: She is alert.  Speech is clear and goal oriented Moves extremities without ataxia  Skin: Skin is warm and dry. She is not diaphoretic.  Psychiatric: She has a normal mood and affect.  Nursing note and vitals reviewed.    ED Treatments / Results   Procedures Procedures (including critical care time)  Medications Ordered in ED Medications  ibuprofen (ADVIL,MOTRIN) tablet 800 mg (800 mg Oral Given 05/30/18 2323)     Initial Impression / Assessment and Plan / ED Course  I have reviewed the triage vital signs and the nursing notes.  Pertinent labs & imaging results that were available during my care of the patient were reviewed by me and considered in my medical decision making (see chart for details).     Presents with 3 months of vaginal bleeding.  Yesterday she was  evaluated in the emergency department and at the GYN clinic.  Hemoglobin was stable, pregnancy test was negative.  She was instructed to restart her OCPs.  She reports she did that this morning however she had cramping again today.  She took hydrocodone this morning which she states did not provide relief however at this time she does not have significant pain.  Long discussion with patient and mother about the potential causes of her pain, emergency versus outpatient evaluation of this and ongoing treatment of her cramping pain.  I have recommended that patient continue with her already scheduled outpatient ultrasound.  No current clinical evidence of ovarian torsion.  At this time I do not believe that she needs additional blood work.  On exam, her abdomen is soft and nontender.  She is eating in the room without difficulty.  Also recommended that patient follow-up with Dr. Marice Potterove if her pain persists.  Patient and mother state understanding and are in agreement with this plan.  Final Clinical Impressions(s) / ED Diagnoses   Final diagnoses:  Dysfunctional uterine bleeding    ED Discharge Orders        Ordered    ibuprofen (ADVIL,MOTRIN)  800 MG tablet  3 times daily     05/30/18 2334    ondansetron (ZOFRAN ODT) 4 MG disintegrating tablet     05/30/18 2334       Tayvien Kane, Boyd Kerbs 05/30/18 2335    Benjiman Core, MD 05/31/18 [id number]

## 2018-06-02 LAB — CERVICOVAGINAL ANCILLARY ONLY
Bacterial vaginitis: POSITIVE — AB
Candida vaginitis: NEGATIVE
Chlamydia: NEGATIVE
Neisseria Gonorrhea: NEGATIVE
Trichomonas: NEGATIVE

## 2018-06-03 ENCOUNTER — Ambulatory Visit (HOSPITAL_COMMUNITY)
Admission: RE | Admit: 2018-06-03 | Discharge: 2018-06-03 | Disposition: A | Discharge status: Home / Self Care | Payer: BLUE CROSS/BLUE SHIELD | Source: Ambulatory Visit | Attending: Obstetrics & Gynecology | Admitting: Obstetrics & Gynecology

## 2018-06-03 DIAGNOSIS — N938 Other specified abnormal uterine and vaginal bleeding: Secondary | ICD-10-CM | POA: Diagnosis not present

## 2018-06-05 ENCOUNTER — Telehealth: Payer: Self-pay | Admitting: Obstetrics & Gynecology

## 2018-06-05 NOTE — Telephone Encounter (Signed)
Patient is calling to get her US Results, patient said she need a call back Today

## 2018-06-05 NOTE — Telephone Encounter (Signed)
Notified pt that her US results were normal.  Pt stated thank you with no further questions.

## 2019-06-05 ENCOUNTER — Other Ambulatory Visit: Payer: Self-pay | Admitting: Obstetrics & Gynecology

## 2019-06-17 ENCOUNTER — Other Ambulatory Visit: Payer: Self-pay | Admitting: Obstetrics & Gynecology

## 2019-06-18 ENCOUNTER — Other Ambulatory Visit: Payer: Self-pay | Admitting: Obstetrics & Gynecology

## 2019-10-31 IMAGING — US US TRANSVAGINAL NON-OB
1 series · 15 of 25 positions shown · non-contrast
Comparison: None.

CLINICAL DATA: Dysfunctional uterine bleeding

EXAM:
ULTRASOUND PELVIS TRANSVAGINAL
TECHNIQUE: Transvaginal ultrasound examination of the pelvis was performed
including evaluation of the uterus, ovaries, adnexal regions, and
pelvic cul-de-sac.

[Series 1: us transvaginal non-ob · 48 acquisitions, 15 frames shown]
[im 1/48]
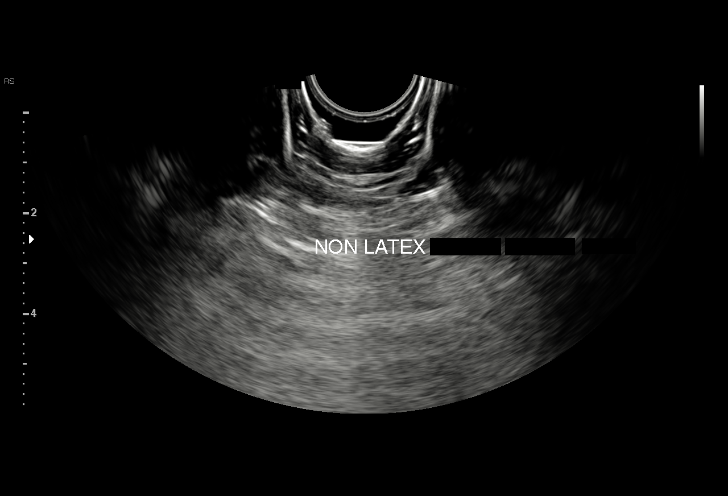
[im 4/48]
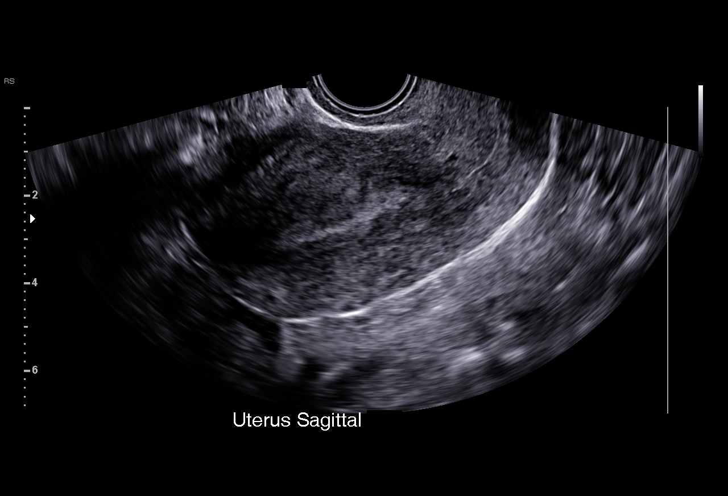
[im 8/48]
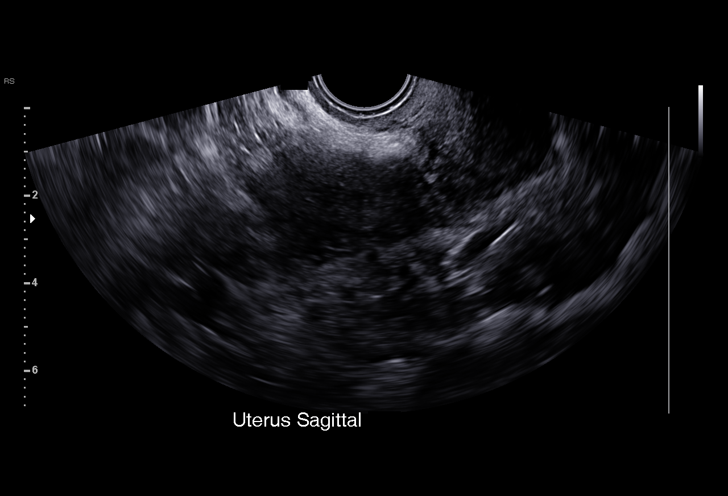
[im 10/48]
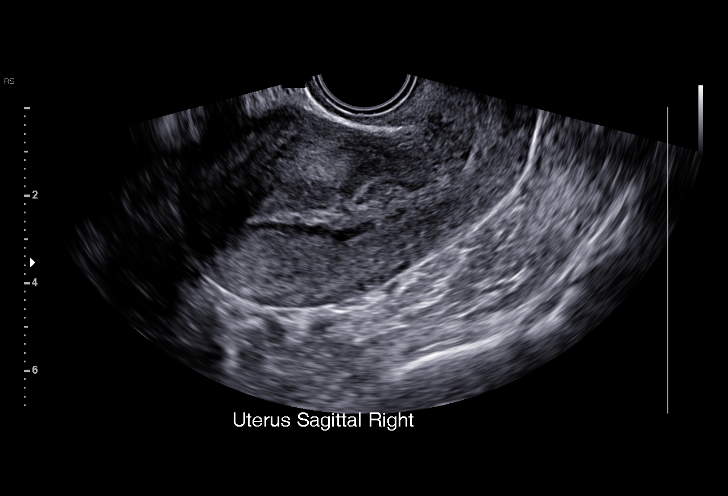
[im 14/48]
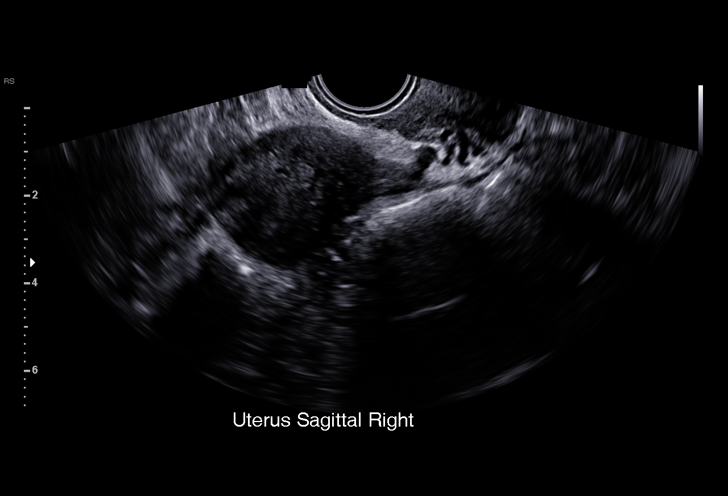
[im 18/48]
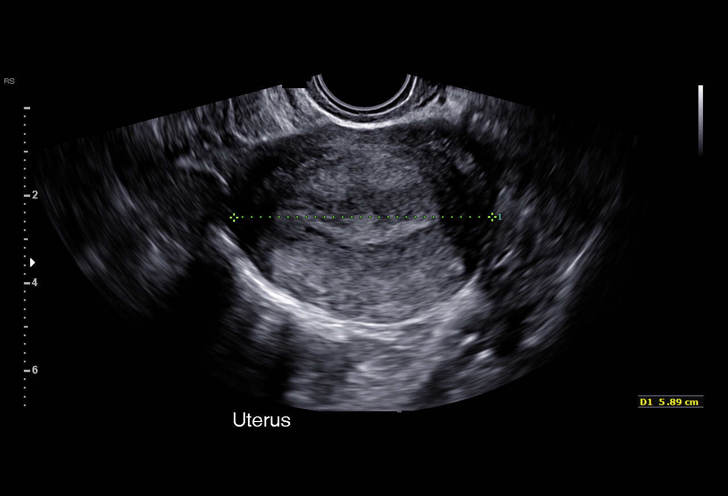
[im 20/48]
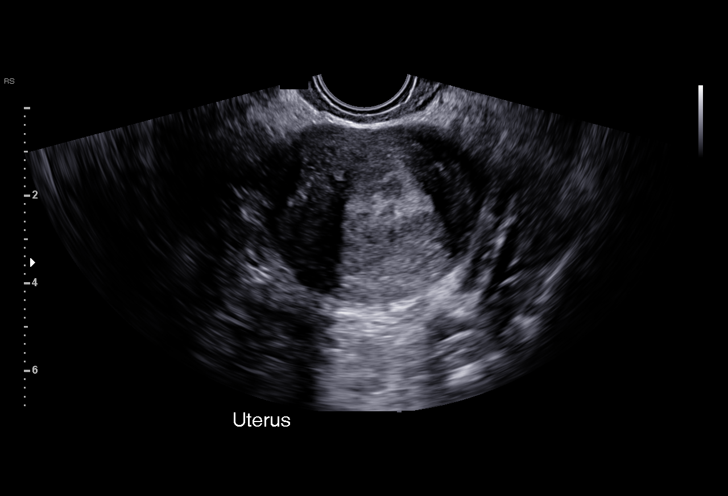
[im 24/48]
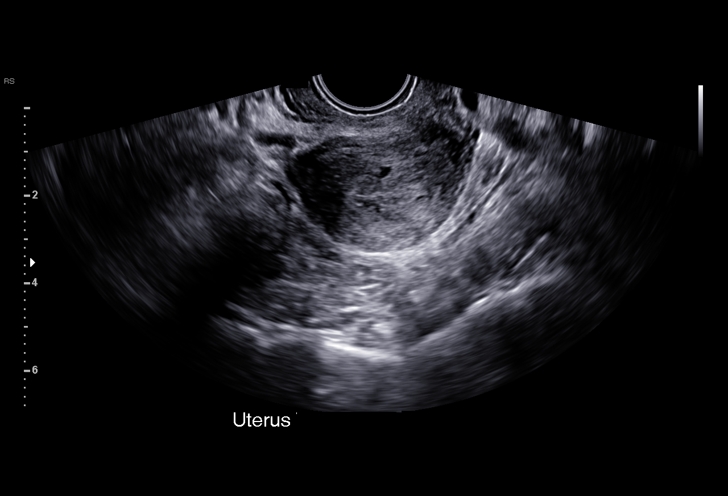
[im 28/48]
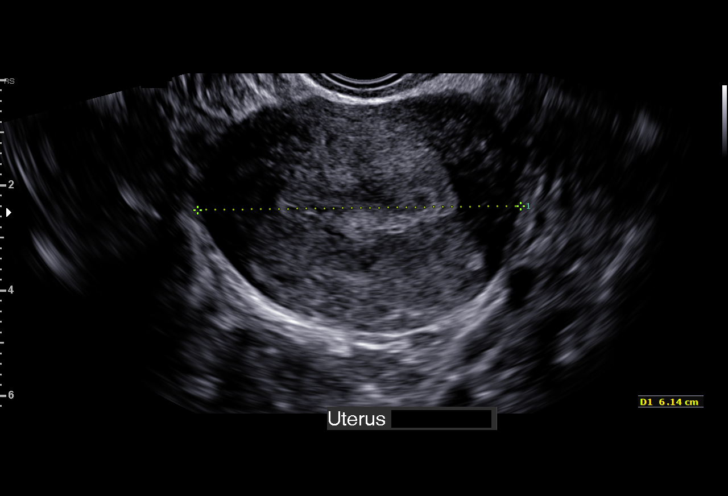
[im 30/48]
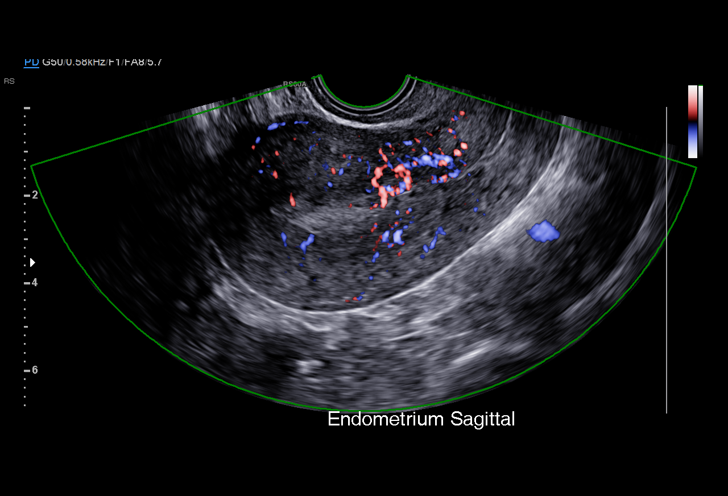
[im 34/48]
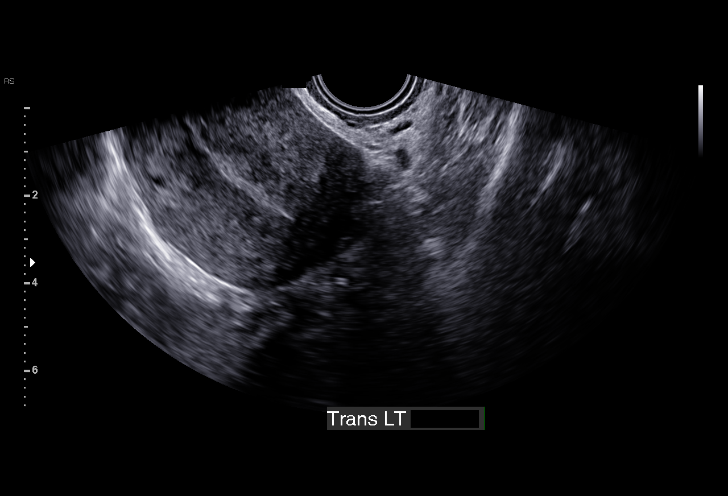
[im 38/48]
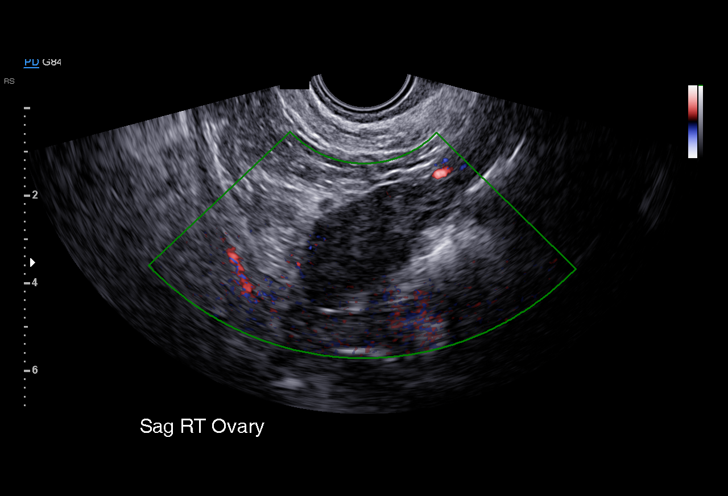
[im 40/48]
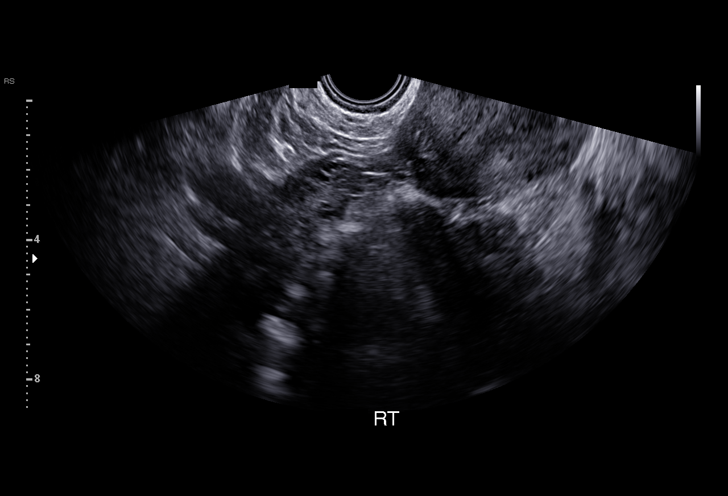
[im 44/48]
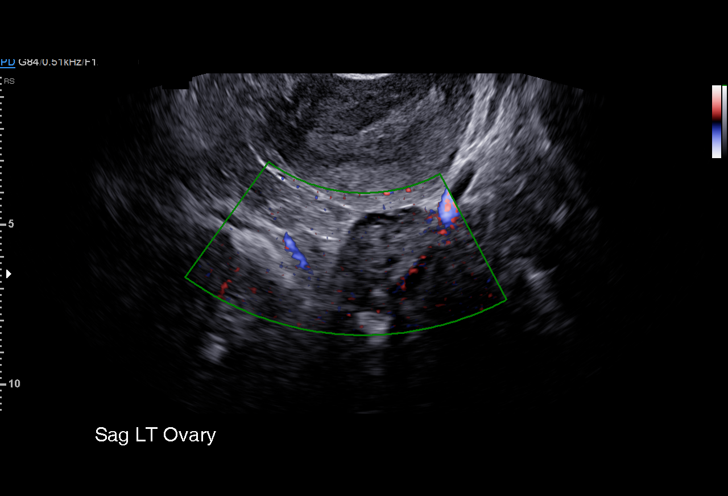
[im 48/48]
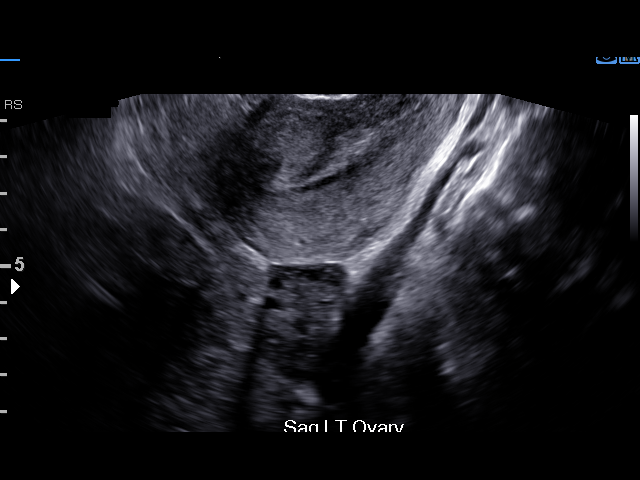

[15 of 25 positions shown; findings below may reference images not displayed]

FINDINGS: Uterus

Measurements: 8.6 x 4.7 x 6.1 cm. No fibroids or other mass
visualized.

Endometrium

Thickness: 6 mm in thickness. Small amount of fluid within the
endometrial canal.. No focal abnormality.

Right ovary

Measurements: 4.2 x 2.3 x 2.4 cm. Normal appearance/no adnexal mass.

Left ovary

Measurements: 4.2 x 2.2 x 2.4 cm. Normal appearance/no adnexal mass.

Other findings:  No abnormal free fluid
IMPRESSION: Unremarkable pelvic ultrasound.

## 2021-04-30 ENCOUNTER — Other Ambulatory Visit (HOSPITAL_BASED_OUTPATIENT_CLINIC_OR_DEPARTMENT_OTHER): Payer: Self-pay

## 2021-04-30 ENCOUNTER — Encounter (HOSPITAL_BASED_OUTPATIENT_CLINIC_OR_DEPARTMENT_OTHER): Payer: Self-pay

## 2021-04-30 ENCOUNTER — Emergency Department (HOSPITAL_BASED_OUTPATIENT_CLINIC_OR_DEPARTMENT_OTHER)
Admission: EM | Admit: 2021-04-30 | Discharge: 2021-04-30 | Disposition: A | Discharge status: Home / Self Care | Payer: BLUE CROSS/BLUE SHIELD | Attending: Emergency Medicine | Admitting: Emergency Medicine

## 2021-04-30 ENCOUNTER — Other Ambulatory Visit: Payer: Self-pay

## 2021-04-30 DIAGNOSIS — L0231 Cutaneous abscess of buttock: Secondary | ICD-10-CM | POA: Insufficient documentation

## 2021-04-30 DIAGNOSIS — F17210 Nicotine dependence, cigarettes, uncomplicated: Secondary | ICD-10-CM | POA: Insufficient documentation

## 2021-04-30 MED ORDER — DOXYCYCLINE HYCLATE 100 MG PO CAPS
100.0000 mg | ORAL_CAPSULE | Freq: Two times a day (BID) | ORAL | 0 refills | Status: DC
  Filled 2021-04-30: qty 20, 10d supply, fill #0

## 2021-04-30 NOTE — ED Triage Notes (Signed)
Pt arrives with c/o boil on her buttock states that she shaved a month ago and it started getting irritated about 2 weeks ago. Pt reports she has had laser hair removal for this reason.

## 2021-04-30 NOTE — ED Notes (Signed)
ED Provider at bedside. 

## 2021-04-30 NOTE — ED Provider Notes (Signed)
[facility] EMERGENCY DEPARTMENT Provider Note   CSN: [id number] Arrival date & time: 04/30/21  0957     History Chief Complaint  Patient presents with   Abscess    Laura Lynch is a 25 y.o. female.  Pt reports she has an abscess on her buttock.  Pt reports area started draining today.  No fever.   The history is provided by the patient. No language interpreter was used.  Abscess Location:  Pelvis Pelvic abscess location:  R buttock Associated symptoms: no fever and no vomiting       History reviewed. No pertinent past medical history.  Patient Active Problem List   Diagnosis Date Noted   Morbid obesity (HCC) 04/30/2018   DUB (dysfunctional uterine bleeding) 04/30/2018    History reviewed. No pertinent surgical history.   OB History     Gravida  1   Para  0   Term  0   Preterm  0   AB  0   Living  0      SAB  0   IAB  0   Ectopic  0   Multiple  0   Live Births  0           No family history on file.  Social History   Tobacco Use   Smoking status: Every Day    Pack years: 0.00    Types: Cigarettes   Smokeless tobacco: Never  Substance Use Topics   Alcohol use: Not Currently   Drug use: Not Currently    Types: Marijuana    Home Medications Prior to Admission medications   Medication Sig Start Date End Date Taking? Authorizing Provider  doxycycline (VIBRAMYCIN) 100 MG capsule Take 1 capsule (100 mg total) by mouth 2 (two) times daily. 04/30/21  Yes Cheron Schaumann K, PA-C  ibuprofen (ADVIL,MOTRIN) 800 MG tablet Take 1 tablet (800 mg total) by mouth 3 (three) times daily. 05/30/18   Muthersbaugh, Dahlia Client, PA-C  megestrol (MEGACE) 40 MG tablet TAKE 1 TABLET(40 MG) BY MOUTH TWICE DAILY 06/21/19   Dove, Myra C, MD  megestrol (MEGACE) 40 MG tablet TAKE 1 TABLET(40 MG) BY MOUTH TWICE DAILY 06/21/19   Allie Bossier, MD  norgestrel-ethinyl estradiol (LO/OVRAL,CRYSELLE) 0.3-30 MG-MCG tablet Take 1 tablet by mouth daily. Patient not  taking: Reported on 05/29/2018 04/30/18   Allie Bossier, MD  ondansetron (ZOFRAN ODT) 4 MG disintegrating tablet 4mg  ODT q4 hours prn nausea/vomit 05/30/18   Muthersbaugh, 06/01/18, PA-C    Allergies    Patient has no known allergies.  Review of Systems   Review of Systems  Constitutional:  Negative for chills and fever.  Gastrointestinal:  Negative for vomiting.  Skin:  Negative for color change and rash.  All other systems reviewed and are negative.  Physical Exam Updated Vital Signs BP (!) 128/98 (BP Location: Right Arm)   Pulse (!) 102   Temp 98.7 F (37.1 C) (Oral)   Resp 18   Ht 5\' 5"  (1.651 m)   Wt 122 kg   LMP 03/31/2021   SpO2 98%   BMI 44.76 kg/m   Physical Exam Vitals and nursing note reviewed.  Constitutional:      Appearance: She is well-developed.  HENT:     Head: Normocephalic.  Cardiovascular:     Rate and Rhythm: Normal rate.  Pulmonary:     Effort: Pulmonary effort is normal.  Abdominal:     General: There is no distension.  Musculoskeletal:  General: Normal range of motion.     Cervical back: Normal range of motion.     Comments: Open area, draining   Skin:    General: Skin is warm.  Neurological:     Mental Status: She is alert and oriented to person, place, and time.    ED Results / Procedures / Treatments   Labs (all labs ordered are listed, but only abnormal results are displayed) Labs Reviewed - No data to display  EKG None  Radiology No results found.  Procedures Procedures   Medications Ordered in ED Medications - No data to display  ED Course  I have reviewed the triage vital signs and the nursing notes.  Pertinent labs & imaging results that were available during my care of the patient were reviewed by me and considered in my medical decision making (see chart for details).    MDM Rules/Calculators/A&P                         MDM:  Pt advised to soak area.  Rfor doxycycline Final Clinical Impression(s) / ED  Diagnoses Final diagnoses:  Abscess of buttock, right    Rx / DC Orders ED Discharge Orders          Ordered    doxycycline (VIBRAMYCIN) 100 MG capsule  2 times daily        04/30/21 1027          An After Visit Summary was printed and given to the patient.    Osie Cheeks 04/30/21 1905    Terrilee Files, MD 05/01/21 [phone]

## 2021-04-30 NOTE — ED Notes (Signed)
Pt ambulatory to waiting room. Pt verbalized understanding of discharge instructions.   

## 2021-04-30 NOTE — Discharge Instructions (Addendum)
Soak area 20 minutes 4 times a day °

## 2021-04-30 NOTE — ED Notes (Signed)
Pt went to restroom states area to right buttock started draining, pt given warm washcloth to drain the area.

## 2021-05-01 ENCOUNTER — Other Ambulatory Visit (HOSPITAL_BASED_OUTPATIENT_CLINIC_OR_DEPARTMENT_OTHER): Payer: Self-pay

## 2021-09-17 ENCOUNTER — Other Ambulatory Visit: Payer: Self-pay

## 2021-09-17 ENCOUNTER — Emergency Department (HOSPITAL_BASED_OUTPATIENT_CLINIC_OR_DEPARTMENT_OTHER)
Admission: EM | Admit: 2021-09-17 | Discharge: 2021-09-17 | Disposition: A | Discharge status: Home / Self Care | Payer: Self-pay | Attending: Emergency Medicine | Admitting: Emergency Medicine

## 2021-09-17 ENCOUNTER — Encounter (HOSPITAL_BASED_OUTPATIENT_CLINIC_OR_DEPARTMENT_OTHER): Payer: Self-pay

## 2021-09-17 DIAGNOSIS — Z20822 Contact with and (suspected) exposure to covid-19: Secondary | ICD-10-CM | POA: Insufficient documentation

## 2021-09-17 DIAGNOSIS — R197 Diarrhea, unspecified: Secondary | ICD-10-CM | POA: Insufficient documentation

## 2021-09-17 DIAGNOSIS — F17210 Nicotine dependence, cigarettes, uncomplicated: Secondary | ICD-10-CM | POA: Insufficient documentation

## 2021-09-17 DIAGNOSIS — J111 Influenza due to unidentified influenza virus with other respiratory manifestations: Secondary | ICD-10-CM | POA: Insufficient documentation

## 2021-09-17 LAB — RESP PANEL BY RT-PCR (FLU A&B, COVID) ARPGX2
Influenza A by PCR: NEGATIVE
Influenza B by PCR: NEGATIVE
SARS Coronavirus 2 by RT PCR: NEGATIVE

## 2021-09-17 NOTE — Discharge Instructions (Addendum)
If you develop high fever, severe cough or cough with blood, trouble breathing, severe headache, neck pain/stiffness, vomiting, or any other new/concerning symptoms then return to the ER for evaluation  

## 2021-09-17 NOTE — ED Triage Notes (Signed)
Pt arrives ambulatory to ED with c/o sore throat, nasal congestion, and states that she was feeling hot over the weekend with some diarrhea and weakness as well. Pt reports symptoms started last Tuesday and states that she will need a note for missing school on Friday.

## 2021-09-17 NOTE — ED Provider Notes (Signed)
[facility] EMERGENCY DEPARTMENT Provider Note   CSN: [id number] Arrival date & time: 09/17/21  3500     History Chief Complaint  Patient presents with  . Sore Throat    Laura Lynch is a 25 y.o. female.  HPI 25 year old female presents with a chief complaint of sore throat.  She has been feeling bad for about 6 days.  She has had sore throat, cough, subjective fever and some diarrhea.  She notes about 2 episodes of diarrhea per day.  Some decreased appetite and generalized weakness.  Sore throat was bad enough that she lost her voice.  She had to miss school on Friday, 11/4.  However now she is feeling somewhat better but still little symptomatic.  Occasional dyspnea.  Last fever was yesterday.  History reviewed. No pertinent past medical history.  Patient Active Problem List   Diagnosis Date Noted  . Morbid obesity (HCC) 04/30/2018  . DUB (dysfunctional uterine bleeding) 04/30/2018    History reviewed. No pertinent surgical history.   OB History     Gravida  1   Para  0   Term  0   Preterm  0   AB  0   Living  0      SAB  0   IAB  0   Ectopic  0   Multiple  0   Live Births  0           No family history on file.  Social History   Tobacco Use  . Smoking status: Some Days    Types: Cigarettes, Cigars  . Smokeless tobacco: Never  Vaping Use  . Vaping Use: Never used  Substance Use Topics  . Alcohol use: Yes    Comment: social  . Drug use: Not Currently    Types: Marijuana    Home Medications Prior to Admission medications   Medication Sig Start Date End Date Taking? Authorizing Provider  doxycycline (VIBRAMYCIN) 100 MG capsule Take 1 capsule (100 mg total) by mouth 2 (two) times daily. 04/30/21   Elson Areas, PA-C  ibuprofen (ADVIL,MOTRIN) 800 MG tablet Take 1 tablet (800 mg total) by mouth 3 (three) times daily. 05/30/18   Muthersbaugh, Dahlia Client, PA-C  megestrol (MEGACE) 40 MG tablet TAKE 1 TABLET(40 MG) BY MOUTH TWICE  DAILY 06/21/19   Dove, Myra C, MD  megestrol (MEGACE) 40 MG tablet TAKE 1 TABLET(40 MG) BY MOUTH TWICE DAILY 06/21/19   Allie Bossier, MD  norgestrel-ethinyl estradiol (LO/OVRAL,CRYSELLE) 0.3-30 MG-MCG tablet Take 1 tablet by mouth daily. Patient not taking: Reported on 05/29/2018 04/30/18   Allie Bossier, MD  ondansetron (ZOFRAN ODT) 4 MG disintegrating tablet 4mg  ODT q4 hours prn nausea/vomit 05/30/18   Muthersbaugh, 06/01/18, PA-C    Allergies    Patient has no known allergies.  Review of Systems   Review of Systems  Constitutional:  Positive for fever.  HENT:  Positive for sore throat.   Respiratory:  Positive for cough and shortness of breath.   Gastrointestinal:  Positive for diarrhea. Negative for abdominal pain and vomiting.  Neurological:  Positive for weakness.  All other systems reviewed and are negative.  Physical Exam Updated Vital Signs BP 124/81 (BP Location: Right Arm)   Pulse 75   Temp 98.5 F (36.9 C) (Oral)   Resp 16   Ht 5\' 5"  (1.651 m)   Wt 125.6 kg   LMP 09/10/2021   SpO2 98%   BMI 46.10 kg/m   Physical Exam  Vitals and nursing note reviewed.  Constitutional:      General: She is not in acute distress.    Appearance: She is well-developed. She is obese. She is not ill-appearing or diaphoretic.  HENT:     Head: Normocephalic and atraumatic.     Right Ear: External ear normal.     Left Ear: External ear normal.     Nose: Nose normal.     Mouth/Throat:     Pharynx: Oropharynx is clear. Uvula midline. No pharyngeal swelling, oropharyngeal exudate, posterior oropharyngeal erythema or uvula swelling.     Tonsils: No tonsillar exudate or tonsillar abscesses.  Eyes:     General:        Right eye: No discharge.        Left eye: No discharge.  Cardiovascular:     Rate and Rhythm: Normal rate and regular rhythm.     Heart sounds: Normal heart sounds.  Pulmonary:     Effort: Pulmonary effort is normal.     Breath sounds: Normal breath sounds.  Abdominal:      Palpations: Abdomen is soft.     Tenderness: There is no abdominal tenderness.  Skin:    General: Skin is warm and dry.  Neurological:     Mental Status: She is alert.  Psychiatric:        Mood and Affect: Mood is not anxious.    ED Results / Procedures / Treatments   Labs (all labs ordered are listed, but only abnormal results are displayed) Labs Reviewed  RESP PANEL BY RT-PCR (FLU A&B, COVID) [id number]    EKG None  Radiology No results found.  Procedures Procedures   Medications Ordered in ED Medications - No data to display  ED Course  I have reviewed the triage vital signs and the nursing notes.  Pertinent labs & imaging results that were available during my care of the patient were reviewed by me and considered in my medical decision making (see chart for details).    MDM Rules/Calculators/A&P                           Patient is well-appearing.  She has normal vital signs.  She is not vomiting.  Given her well appearance with clear lungs and no hypoxia I think bacterial infection such as pneumonia is pretty unlikely.  I do not think labs are warranted.  She is actually feeling a lot better but states she needs to have documentation that she was sick for school.  Sounds like an influenza-like illness.  COVID/flu swab has been sent by nursing staff already.  Otherwise, she is out of any type of treatment window for flu or COVID if warranted.  Seems stable for discharge with return precautions. Final Clinical Impression(s) / ED Diagnoses Final diagnoses:  Influenza-like illness    Rx / DC Orders ED Discharge Orders     None        Pricilla Loveless, MD 09/17/21 [phone]

## 2022-01-01 ENCOUNTER — Emergency Department (HOSPITAL_BASED_OUTPATIENT_CLINIC_OR_DEPARTMENT_OTHER)
Admission: EM | Admit: 2022-01-01 | Discharge: 2022-01-01 | Disposition: A | Discharge status: Home / Self Care | Payer: Self-pay

## 2022-07-27 ENCOUNTER — Emergency Department (HOSPITAL_BASED_OUTPATIENT_CLINIC_OR_DEPARTMENT_OTHER)
Admission: EM | Admit: 2022-07-27 | Discharge: 2022-07-27 | Disposition: A | Discharge status: Home / Self Care | Payer: BC Managed Care – PPO | Attending: Emergency Medicine | Admitting: Emergency Medicine

## 2022-07-27 ENCOUNTER — Emergency Department (HOSPITAL_BASED_OUTPATIENT_CLINIC_OR_DEPARTMENT_OTHER): Payer: BC Managed Care – PPO

## 2022-07-27 ENCOUNTER — Encounter (HOSPITAL_BASED_OUTPATIENT_CLINIC_OR_DEPARTMENT_OTHER): Payer: Self-pay | Admitting: Emergency Medicine

## 2022-07-27 ENCOUNTER — Other Ambulatory Visit: Payer: Self-pay

## 2022-07-27 DIAGNOSIS — D72829 Elevated white blood cell count, unspecified: Secondary | ICD-10-CM | POA: Diagnosis not present

## 2022-07-27 DIAGNOSIS — F17200 Nicotine dependence, unspecified, uncomplicated: Secondary | ICD-10-CM | POA: Diagnosis not present

## 2022-07-27 DIAGNOSIS — R072 Precordial pain: Secondary | ICD-10-CM | POA: Diagnosis not present

## 2022-07-27 LAB — CBC
HCT: 43.2 % (ref 36.0–46.0)
Hemoglobin: 14.5 g/dL (ref 12.0–15.0)
MCH: 30.8 pg (ref 26.0–34.0)
MCHC: 33.6 g/dL (ref 30.0–36.0)
MCV: 91.7 fL (ref 80.0–100.0)
Platelets: 243 10*3/uL (ref 150–400)
RBC: 4.71 MIL/uL (ref 3.87–5.11)
RDW: 14.1 % (ref 11.5–15.5)
WBC: 11.1 10*3/uL — ABNORMAL HIGH (ref 4.0–10.5)
nRBC: 0 % (ref 0.0–0.2)

## 2022-07-27 LAB — BASIC METABOLIC PANEL
Anion gap: 6 (ref 5–15)
BUN: 9 mg/dL (ref 6–20)
CO2: 28 mmol/L (ref 22–32)
Calcium: 8.9 mg/dL (ref 8.9–10.3)
Chloride: 103 mmol/L (ref 98–111)
Creatinine, Ser: 0.93 mg/dL (ref 0.44–1.00)
GFR, Estimated: >60 mL/min (ref >60)
Glucose, Bld: 77 mg/dL (ref 70–99)
Potassium: 4.2 mmol/L (ref 3.5–5.1)
Sodium: 137 mmol/L (ref 135–145)

## 2022-07-27 LAB — URINALYSIS, ROUTINE W REFLEX MICROSCOPIC
Bilirubin Urine: NEGATIVE
Glucose, UA: NEGATIVE mg/dL
Hgb urine dipstick: NEGATIVE
Ketones, ur: NEGATIVE mg/dL
Leukocytes,Ua: NEGATIVE
Nitrite: NEGATIVE
Protein, ur: NEGATIVE mg/dL
Specific Gravity, Urine: 1.025 (ref 1.005–1.030)
pH: 6 (ref 5.0–8.0)

## 2022-07-27 LAB — D-DIMER, QUANTITATIVE: D-Dimer, Quant: 0.31 ug/mL-FEU (ref 0.00–0.50)

## 2022-07-27 LAB — PREGNANCY, URINE: Preg Test, Ur: NEGATIVE

## 2022-07-27 LAB — TROPONIN I (HIGH SENSITIVITY): Troponin I (High Sensitivity): <2 ng/L (ref <18)

## 2022-07-27 MED ORDER — KETOROLAC TROMETHAMINE 30 MG/ML IJ SOLN
30.0000 mg | Freq: Once | INTRAMUSCULAR | Status: AC
  Administered 2022-07-27: 30 mg via INTRAVENOUS
  Filled 2022-07-27: qty 1

## 2022-07-27 NOTE — Discharge Instructions (Signed)
Your work-up today was reassuring.  Given a dose of anti-inflammatory.  This typically takes around 24 to 48 hours to help.  Make sure to keep a close eye on your symptoms.  Return for new or worsening symptoms otherwise help with primary care provider

## 2022-07-27 NOTE — ED Provider Notes (Signed)
[facility] EMERGENCY DEPARTMENT Provider Note   CSN: [id number] Arrival date & time: 07/27/22  1444    History  Chief Complaint  Patient presents with   Chest Pain    Laura Lynch is a 26 y.o. female here for evaluation of chest pain.  Began yesterday.  Pain constant in nature.  Worse with deep breathing and movement.  No recent surgery, immobilization or malignancy.  No lower extremity swelling.  No PND orthopnea.  Pain is nonexertional in nature.  Is never had anything like this previously.  No meds PTA.  Does smoke daily.  On OCPs.  HPI     Home Medications Prior to Admission medications   Medication Sig Start Date End Date Taking? Authorizing Provider  doxycycline (VIBRAMYCIN) 100 MG capsule Take 1 capsule (100 mg total) by mouth 2 (two) times daily. 04/30/21   Elson Areas, PA-C  ibuprofen (ADVIL,MOTRIN) 800 MG tablet Take 1 tablet (800 mg total) by mouth 3 (three) times daily. 05/30/18   Muthersbaugh, Dahlia Client, PA-C  megestrol (MEGACE) 40 MG tablet TAKE 1 TABLET(40 MG) BY MOUTH TWICE DAILY 06/21/19   Dove, Myra C, MD  megestrol (MEGACE) 40 MG tablet TAKE 1 TABLET(40 MG) BY MOUTH TWICE DAILY 06/21/19   Allie Bossier, MD  norgestrel-ethinyl estradiol (LO/OVRAL,CRYSELLE) 0.3-30 MG-MCG tablet Take 1 tablet by mouth daily. Patient not taking: Reported on 05/29/2018 04/30/18   Allie Bossier, MD  ondansetron (ZOFRAN ODT) 4 MG disintegrating tablet 4mg  ODT q4 hours prn nausea/vomit 05/30/18   Muthersbaugh, 06/01/18, PA-C      Allergies    Patient has no known allergies.    Review of Systems   Review of Systems  Constitutional: Negative.  Negative for unexpected weight change.  HENT: Negative.    Respiratory: Negative.    Cardiovascular:  Positive for chest pain. Negative for palpitations and leg swelling.  Gastrointestinal: Negative.   Genitourinary: Negative.   Musculoskeletal: Negative.   Skin: Negative.   Neurological: Negative.   All other systems reviewed and are  negative.   Physical Exam Updated Vital Signs BP (!) 100/41   Pulse 71   Temp 98.1 F (36.7 C) (Oral)   Resp (!) 26   Ht 5\' 5"  (1.651 m)   Wt 126.1 kg   LMP 06/15/2022   SpO2 99%   BMI 46.26 kg/m  Physical Exam Vitals and nursing note reviewed.  Constitutional:      General: She is not in acute distress.    Appearance: She is well-developed. She is not ill-appearing, toxic-appearing or diaphoretic.  HENT:     Head: Atraumatic.  Eyes:     Pupils: Pupils are equal, round, and reactive to light.  Cardiovascular:     Rate and Rhythm: Normal rate.     Pulses: Normal pulses.          Radial pulses are 2+ on the right side and 2+ on the left side.     Comments: Mid line sternal tenderness without crepitus or step-off. Pulmonary:     Effort: Pulmonary effort is normal. No respiratory distress.     Breath sounds: Normal breath sounds and air entry.     Comments: Clear bilaterally, speaks in full sentences without difficulty Chest:       Comments: Reproducible chest wall tenderness without crepitus or step-off Abdominal:     General: Bowel sounds are normal. There is no distension.     Palpations: Abdomen is soft.     Tenderness: There is  no abdominal tenderness.     Comments: Soft, nontender without rebound or guarding  Musculoskeletal:        General: Normal range of motion.     Cervical back: Normal range of motion.     Comments: No bony tenderness, full range of motion, compartment soft.  No lower extremity edema  Skin:    General: Skin is warm and dry.     Capillary Refill: Capillary refill takes less than 2 seconds.     Comments: No edema, erythema or warmth.  No fluctuance or induration.  Neurological:     General: No focal deficit present.     Mental Status: She is alert.  Psychiatric:        Mood and Affect: Mood normal.     ED Results / Procedures / Treatments   Labs (all labs ordered are listed, but only abnormal results are displayed) Labs Reviewed   CBC - Abnormal; Notable for the following components:      Result Value   WBC 11.1 (*)    All other components within normal limits  BASIC METABOLIC PANEL  PREGNANCY, URINE  URINALYSIS, ROUTINE W REFLEX MICROSCOPIC  D-DIMER, QUANTITATIVE  TROPONIN I (HIGH SENSITIVITY)    EKG None  Radiology DG Chest 2 View  Result Date: 07/27/2022 CLINICAL DATA:  Chest pain. EXAM: CHEST - 2 VIEW COMPARISON:  Chest x-ray report dated February 21, 2019. FINDINGS: The heart size and mediastinal contours are within normal limits. Both lungs are clear. The visualized skeletal structures are unremarkable. IMPRESSION: No active cardiopulmonary disease. Electronically Signed   By: Titus Dubin M.D.   On: 07/27/2022 15:57    Procedures Procedures    Medications Ordered in ED Medications  ketorolac (TORADOL) 30 MG/ML injection 30 mg (30 mg Intravenous Given 07/27/22 1731)    ED Course/ Medical Decision Making/ A&P    26 year old here for evaluation of chest pain.  Began yesterday.  Chronic in nature however worse with movement, deep breathing.  On OCPs.  Is a tobacco user.  Does not appear clinically volume overloaded. She has no PND or orthopnea.  It is reproducible in nature however she is not PERC negative due to OCPs.  Will obtain D-dimer.  Symptoms do not seem consistent with ACS, unstable angina, dissection.  Upper respiratory symptoms to suggest COVID, pneumonia or other viral illness. No abdominal pain, vomiting, GERD symptoms to suggest atypical intra-abdominal etiology of her chest pain.  Labs and imaging personally viewed and interpreted:  Leukocytosis 83.4 Metabolic panel without significant abnormality UA negative for infection Troponin less than 2 D-dimer 0.31 Chest x-ray without cardiomegaly, edema, pneumothorax, infiltrates EKG without ischemic changes  Patient reassessed.  I am able to reproduce her pain on exam.  While there is a pleuritic component and her D-dimer is within normal  limits I do not feel she needs a CTA at this time I have low suspicion for PE.  Suspect possible costochondritis as cause of her symptoms.  She will be given dose of Toradol here.  Her heart score is 0.  Symptoms do not seem consistent myocarditis, pericarditis or endocarditis.  No evidence of heart failure on exam does not appear grossly fluid overloaded.  No infectious symptoms.  We will have her follow-up outpatient.  Return for new or worsening symptoms.  The patient has been appropriately medically screened and/or stabilized in the ED. I have low suspicion for any other emergent medical condition which would require further screening, evaluation or treatment in the  ED or require inpatient management.  Patient is hemodynamically stable and in no acute distress.  Patient able to ambulate in department prior to ED.  Evaluation does not show acute pathology that would require ongoing or additional emergent interventions while in the emergency department or further inpatient treatment.  I have discussed the diagnosis with the patient and answered all questions.  Pain is been managed while in the emergency department and patient has no further complaints prior to discharge.  Patient is comfortable with plan discussed in room and is stable for discharge at this time.  I have discussed strict return precautions for returning to the emergency department.  Patient was encouraged to follow-up with PCP/specialist refer to at discharge.                            Medical Decision Making Amount and/or Complexity of Data Reviewed External Data Reviewed: labs, radiology, ECG and notes. Labs: ordered. Decision-making details documented in ED Course. Radiology: ordered and independent interpretation performed. Decision-making details documented in ED Course. ECG/medicine tests: ordered and independent interpretation performed. Decision-making details documented in ED Course.  Risk OTC drugs. Prescription drug  management. Decision regarding hospitalization. Diagnosis or treatment significantly limited by social determinants of health.          Final Clinical Impression(s) / ED Diagnoses Final diagnoses:  Precordial pain    Rx / DC Orders ED Discharge Orders     None         Jveon Pound A, PA-C 07/27/22 1733    Vanetta Mulders, MD 07/28/22 1845

## 2022-07-27 NOTE — ED Triage Notes (Signed)
Patient c/o mid chest pain onset last night. Pain occurred while lying down.

## 2022-07-27 NOTE — ED Notes (Signed)
Pain center chest smoke black and milds daily Vaginal discharge started Tuesday vaginal discharge no odor vagina smell like garlic

## 2022-07-27 NOTE — ED Notes (Signed)
Patient in ct 

## 2022-07-27 NOTE — ED Notes (Signed)
Patient transported to X-ray 

## 2022-09-12 ENCOUNTER — Emergency Department (HOSPITAL_BASED_OUTPATIENT_CLINIC_OR_DEPARTMENT_OTHER): Payer: Self-pay

## 2022-09-12 ENCOUNTER — Encounter (HOSPITAL_BASED_OUTPATIENT_CLINIC_OR_DEPARTMENT_OTHER): Payer: Self-pay | Admitting: Emergency Medicine

## 2022-09-12 ENCOUNTER — Emergency Department (HOSPITAL_BASED_OUTPATIENT_CLINIC_OR_DEPARTMENT_OTHER)
Admission: EM | Admit: 2022-09-12 | Discharge: 2022-09-12 | Disposition: A | Discharge status: Home / Self Care | Payer: Self-pay | Attending: Emergency Medicine | Admitting: Emergency Medicine

## 2022-09-12 ENCOUNTER — Other Ambulatory Visit: Payer: Self-pay

## 2022-09-12 DIAGNOSIS — Z23 Encounter for immunization: Secondary | ICD-10-CM | POA: Insufficient documentation

## 2022-09-12 DIAGNOSIS — S61216A Laceration without foreign body of right little finger without damage to nail, initial encounter: Secondary | ICD-10-CM | POA: Insufficient documentation

## 2022-09-12 DIAGNOSIS — W260XXA Contact with knife, initial encounter: Secondary | ICD-10-CM | POA: Insufficient documentation

## 2022-09-12 MED ORDER — LIDOCAINE HCL (PF) 1 % IJ SOLN
5.0000 mL | Freq: Once | INTRAMUSCULAR | Status: AC
  Administered 2022-09-12: 5 mL
  Filled 2022-09-12: qty 5

## 2022-09-12 MED ORDER — TETANUS-DIPHTH-ACELL PERTUSSIS 5-2.5-18.5 LF-MCG/0.5 IM SUSY
0.5000 mL | PREFILLED_SYRINGE | Freq: Once | INTRAMUSCULAR | Status: AC
  Administered 2022-09-12: 0.5 mL via INTRAMUSCULAR
  Filled 2022-09-12: qty 0.5

## 2022-09-12 MED ORDER — IBUPROFEN 800 MG PO TABS: 800.0000 mg | ORAL_TABLET | Freq: Three times a day (TID) | ORAL | 0 refills | Status: DC

## 2022-09-12 MED ORDER — BACITRACIN ZINC 500 UNIT/GM EX OINT: TOPICAL_OINTMENT | Freq: Once | CUTANEOUS | Status: AC

## 2022-09-12 NOTE — ED Triage Notes (Signed)
Reports left fifth finger laceration using knife , 1 hour ago . Bleeding controlled

## 2022-09-12 NOTE — Discharge Instructions (Addendum)
You were seen in the emergency department today for a laceration to your finger. You will return here, urgent care or primary care in 7 days to have the sutures removed. Please return sooner for signs of infection. Please use a basic neosporin ointment for the first 2 days on the wound. Please do not submerge your finger in water or scrub over the area.

## 2022-09-12 NOTE — ED Provider Notes (Signed)
[facility] EMERGENCY DEPARTMENT Provider Note   CSN: [id number] Arrival date & time: 09/12/22  1156     History  Chief Complaint  Patient presents with   Laceration    Left 5th finger    Laura Lynch is a 26 y.o. female. With no pertinent past medical history who presents to the emergency department with finger laceration.  States she was at home cutting up food when she accidentally slipped, cutting the volar aspect of her right pinky. Currently denying pain. Not on anticoagulation. States last tetanus was >10 years ago. Denies other injuries.    Laceration      Home Medications Prior to Admission medications   Medication Sig Start Date End Date Taking? Authorizing Provider  ibuprofen (ADVIL) 800 MG tablet Take 1 tablet (800 mg total) by mouth 3 (three) times daily. 09/12/22  Yes Mickie Hillier, PA-C  doxycycline (VIBRAMYCIN) 100 MG capsule Take 1 capsule (100 mg total) by mouth 2 (two) times daily. 04/30/21   Fransico Meadow, PA-C  ibuprofen (ADVIL,MOTRIN) 800 MG tablet Take 1 tablet (800 mg total) by mouth 3 (three) times daily. 05/30/18   Muthersbaugh, Jarrett Soho, PA-C  megestrol (MEGACE) 40 MG tablet TAKE 1 TABLET(40 MG) BY MOUTH TWICE DAILY 06/21/19   Dove, Myra C, MD  megestrol (MEGACE) 40 MG tablet TAKE 1 TABLET(40 MG) BY MOUTH TWICE DAILY 06/21/19   Emily Filbert, MD  norgestrel-ethinyl estradiol (LO/OVRAL,CRYSELLE) 0.3-30 MG-MCG tablet Take 1 tablet by mouth daily. Patient not taking: Reported on 05/29/2018 04/30/18   Emily Filbert, MD  ondansetron (ZOFRAN ODT) 4 MG disintegrating tablet 4mg  ODT q4 hours prn nausea/vomit 05/30/18   Muthersbaugh, Jarrett Soho, PA-C      Allergies    Patient has no known allergies.    Review of Systems   Review of Systems  Skin:  Positive for wound.  All other systems reviewed and are negative.   Physical Exam Updated Vital Signs BP (!) 120/92 (BP Location: Left Arm)   Pulse (!) 110   Temp 98.8 F (37.1 C) (Oral)   Resp 18    Ht 5\' 5"  (1.651 m)   Wt 127 kg   LMP 09/10/2022 (Exact Date)   SpO2 97%   BMI 46.59 kg/m  Physical Exam Vitals and nursing note reviewed.  Constitutional:      General: She is not in acute distress.    Appearance: Normal appearance. She is obese. She is not ill-appearing or toxic-appearing.  HENT:     Head: Normocephalic and atraumatic.  Eyes:     General: No scleral icterus. Pulmonary:     Effort: Pulmonary effort is normal. No respiratory distress.  Skin:    General: Skin is warm and dry.     Findings: No rash.     Comments: 2cm laceration to the volar right little finger. Hemostatic. No vascular injuries or ligamentous/tendinous injury appreciated  Has full ROM. Sensation intact.   Neurological:     General: No focal deficit present.     Mental Status: She is alert.  Psychiatric:        Mood and Affect: Mood normal.        Behavior: Behavior normal.        Thought Content: Thought content normal.        Judgment: Judgment normal.     ED Results / Procedures / Treatments   Labs (all labs ordered are listed, but only abnormal results are displayed) Labs Reviewed - No data to  display  EKG None  Radiology DG Finger Little Right  Result Date: 09/12/2022 CLINICAL DATA:  Finger laceration EXAM: RIGHT LITTLE FINGER 3V COMPARISON:  None Available. FINDINGS: No evidence of fracture or dislocation. No evidence of arthropathy or other focal bone abnormality. Soft tissue swelling which is most pronounced at the base of the small finger. IMPRESSION: Soft tissue swelling is most pronounced at the base of the small finger. No associated bony abnormality. Electronically Signed   By: Yetta Glassman M.D.   On: 09/12/2022 15:27    Procedures .Marland KitchenLaceration Repair  Date/Time: 09/12/2022 5:34 PM  Performed by: Mickie Hillier, PA-C Authorized by: Mickie Hillier, PA-C   Consent:    Consent obtained:  Verbal   Consent given by:  Patient   Risks, benefits, and alternatives were  discussed: yes     Risks discussed:  Infection, pain, retained foreign body, need for additional repair, poor cosmetic result, tendon damage, nerve damage, poor wound healing and vascular damage   Alternatives discussed:  No treatment Universal protocol:    Procedure explained and questions answered to patient or proxy's satisfaction: yes     Relevant documents present and verified: yes     Test results available: yes     Imaging studies available: yes     Required blood products, implants, devices, and special equipment available: yes     Site/side marked: yes     Immediately prior to procedure, a time out was called: yes     Patient identity confirmed:  Verbally with patient Anesthesia:    Anesthesia method:  Local infiltration   Local anesthetic:  Lidocaine 1% w/o epi Laceration details:    Location:  Finger   Finger location:  R small finger   Length (cm):  2   Depth (mm):  4 Pre-procedure details:    Preparation:  Patient was prepped and draped in usual sterile fashion and imaging obtained to evaluate for foreign bodies Exploration:    Limited defect created (wound extended): no     Hemostasis achieved with:  Direct pressure   Imaging obtained: x-ray     Imaging outcome: foreign body not noted     Wound exploration: wound explored through full range of motion and entire depth of wound visualized     Wound extent: areolar tissue violated     Wound extent: no foreign body, no signs of injury, no tendon damage, no underlying fracture and no vascular damage     Contaminated: yes   Treatment:    Area cleansed with:  Povidone-iodine   Amount of cleaning:  Extensive   Irrigation solution:  Sterile saline   Irrigation volume:  1000   Irrigation method:  Pressure wash   Visualized foreign bodies/material removed: no     Debridement:  Minimal   Undermining:  None Skin repair:    Repair method:  Sutures   Suture size:  5-0   Suture material:  Prolene   Suture technique:  Simple  interrupted   Number of sutures:  4 Approximation:    Approximation:  Close Repair type:    Repair type:  Simple Post-procedure details:    Dressing:  Antibiotic ointment   Procedure completion:  Tolerated well, no immediate complications   Medications Ordered in ED Medications  lidocaine (PF) (XYLOCAINE) 1 % injection 5 mL (5 mLs Infiltration Given 09/12/22 1626)  Tdap (BOOSTRIX) injection 0.5 mL (0.5 mLs Intramuscular Given 09/12/22 1753)  bacitracin ointment ( Topical Given 09/12/22 1755)  ED Course/ Medical Decision Making/ A&P                           Medical Decision Making Amount and/or Complexity of Data Reviewed Radiology: ordered.  Risk OTC drugs. Prescription drug management.  This patient presents to the ED with chief complaint(s) of laceration with pertinent past medical history of none which further complicates the presenting complaint. The complaint involves an extensive differential diagnosis and also carries with it a high risk of complications and morbidity.    The differential diagnosis includes laceration, underlying FB or fracture   Additional history obtained: Additional history obtained from family Records reviewed Care Everywhere/External Records and Primary Care Documents  ED Course and Reassessment: 26 year old female who presents to the emergency department with laceration to the right 5th digit.  Neurovascularly intact. I do not visualize any FB or contamination to the wound. Plain film without FB present.  Updated tetanus. Her finger was soaked in an iodine-saline mixture for 20 minutes. I then flushed the wound with saline. Placed 4, 5-0 prolene sutures with close closure. No immediate complications. She tolerated the procedure well. She is asking for pain medication for home although she states she is currently not in pain. Will give her rx for ibuprofen. Given return precautions for signs of infection. 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 I instructed her not to use this splint if she is not working. She verbalizes understanding. Otherwise feel she is safe for discharge at this time with return in 7 days for suture removal.   Independent labs interpretation:  The following labs were independently interpreted: not indicated  Independent visualization of imaging: - I independently visualized the following imaging with scope of interpretation limited to determining acute life threatening conditions related to emergency care: XR little finger, which revealed no FB present  Consultation: - Consulted or discussed management/test interpretation w/ external professional: not indicated   Consideration for admission or further workup: not indicated  Social Determinants of health: none identified  Final Clinical Impression(s) / ED Diagnoses Final diagnoses:  Laceration of right little finger without foreign body without damage to nail, initial encounter    Rx / DC Orders ED Discharge Orders          Ordered    ibuprofen (ADVIL) 800 MG tablet  3 times daily        09/12/22 1736              Mickie Hillier, PA-C 09/12/22 1853    Fransico Meadow, MD 09/13/22 1123

## 2022-09-26 ENCOUNTER — Other Ambulatory Visit: Payer: Self-pay

## 2022-09-26 ENCOUNTER — Encounter (HOSPITAL_BASED_OUTPATIENT_CLINIC_OR_DEPARTMENT_OTHER): Payer: Self-pay | Admitting: Emergency Medicine

## 2022-09-26 ENCOUNTER — Emergency Department (HOSPITAL_BASED_OUTPATIENT_CLINIC_OR_DEPARTMENT_OTHER)
Admission: EM | Admit: 2022-09-26 | Discharge: 2022-09-27 | Disposition: A | Discharge status: Home / Self Care | Payer: BC Managed Care – PPO | Attending: Emergency Medicine | Admitting: Emergency Medicine

## 2022-09-26 DIAGNOSIS — N898 Other specified noninflammatory disorders of vagina: Secondary | ICD-10-CM | POA: Insufficient documentation

## 2022-09-26 DIAGNOSIS — R102 Pelvic and perineal pain: Secondary | ICD-10-CM | POA: Diagnosis present

## 2022-09-26 LAB — URINALYSIS, ROUTINE W REFLEX MICROSCOPIC
Bilirubin Urine: NEGATIVE
Glucose, UA: NEGATIVE mg/dL
Hgb urine dipstick: NEGATIVE
Ketones, ur: NEGATIVE mg/dL
Leukocytes,Ua: NEGATIVE
Nitrite: NEGATIVE
Protein, ur: NEGATIVE mg/dL
Specific Gravity, Urine: >=1.03 (ref 1.005–1.030)
pH: 5.5 (ref 5.0–8.0)

## 2022-09-26 LAB — WET PREP, GENITAL
Clue Cells Wet Prep HPF POC: NONE SEEN
Sperm: NONE SEEN
Trich, Wet Prep: NONE SEEN
WBC, Wet Prep HPF POC: <10 (ref <10)
Yeast Wet Prep HPF POC: NONE SEEN

## 2022-09-26 LAB — PREGNANCY, URINE: Preg Test, Ur: NEGATIVE

## 2022-09-26 NOTE — ED Triage Notes (Signed)
Pt reports having unprotected sex 1 week ago. Now having pelvic pain, burning with urination, white discharge, vaginal itching, and anal pain. Denies odor. Denies vaginal bleeding.

## 2022-09-27 LAB — HIV ANTIBODY (ROUTINE TESTING W REFLEX): HIV Screen 4th Generation wRfx: NONREACTIVE

## 2022-09-27 MED ORDER — DOXYCYCLINE HYCLATE 100 MG PO TABS
100.0000 mg | ORAL_TABLET | Freq: Once | ORAL | Status: AC
  Administered 2022-09-27: 100 mg via ORAL
  Filled 2022-09-27: qty 1

## 2022-09-27 MED ORDER — FLUCONAZOLE 150 MG PO TABS: 150.0000 mg | ORAL_TABLET | Freq: Every day | ORAL | 0 refills | Status: AC

## 2022-09-27 MED ORDER — CEFTRIAXONE SODIUM 500 MG IJ SOLR
500.0000 mg | Freq: Once | INTRAMUSCULAR | Status: DC
  Filled 2022-09-27: qty 500

## 2022-09-27 MED ORDER — HYDROCORTISONE (PERIANAL) 2.5 % EX CREA: 1.0000 | TOPICAL_CREAM | Freq: Two times a day (BID) | CUTANEOUS | 0 refills | Status: AC

## 2022-09-27 MED ORDER — DOXYCYCLINE HYCLATE 100 MG PO CAPS: 100.0000 mg | ORAL_CAPSULE | Freq: Two times a day (BID) | ORAL | 0 refills | Status: AC
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
## 2022-09-27 NOTE — Discharge Instructions (Signed)
You were seen in the emergency room today with pelvic pain.  Your STD test results will come back in the next 2 days in the MyChart app.  Please follow these closely.  I am treating you with antibiotic for the next week.  If you do in fact have a sexually-transmitted infection and will not be fully treated until he finished antibiotics.

## 2022-09-28 LAB — RPR: RPR Ser Ql: NONREACTIVE

## 2022-09-30 LAB — GC/CHLAMYDIA PROBE AMP (~~LOC~~) NOT AT ARMC
Chlamydia: NEGATIVE
Comment: NEGATIVE
Comment: NORMAL
Neisseria Gonorrhea: NEGATIVE

## 2022-10-02 ENCOUNTER — Encounter (HOSPITAL_BASED_OUTPATIENT_CLINIC_OR_DEPARTMENT_OTHER): Payer: Self-pay

## 2022-10-02 ENCOUNTER — Other Ambulatory Visit: Payer: Self-pay

## 2022-10-02 ENCOUNTER — Emergency Department (HOSPITAL_BASED_OUTPATIENT_CLINIC_OR_DEPARTMENT_OTHER)
Admission: EM | Admit: 2022-10-02 | Discharge: 2022-10-02 | Disposition: A | Discharge status: Home / Self Care | Payer: BC Managed Care – PPO | Attending: Emergency Medicine | Admitting: Emergency Medicine

## 2022-10-02 DIAGNOSIS — R21 Rash and other nonspecific skin eruption: Secondary | ICD-10-CM | POA: Diagnosis present

## 2022-10-02 DIAGNOSIS — N766 Ulceration of vulva: Secondary | ICD-10-CM

## 2022-10-02 DIAGNOSIS — N765 Ulceration of vagina: Secondary | ICD-10-CM | POA: Diagnosis not present

## 2022-10-02 MED ORDER — ACYCLOVIR 400 MG PO TABS: 400.0000 mg | ORAL_TABLET | Freq: Three times a day (TID) | ORAL | 0 refills | Status: AC

## 2022-10-02 NOTE — ED Notes (Signed)
Rx x 1 given  Written and verbal inst to pt  Verbalized an understanding  To home  

## 2022-10-02 NOTE — ED Triage Notes (Signed)
Pt states that she used a feminine wash and now has a rash which she believes might be herpes. Pt states the rash is closer to the sacral area of her buttocks region. Pt reports itching in the area.

## 2022-10-02 NOTE — ED Provider Notes (Signed)
[facility] EMERGENCY DEPARTMENT Provider Note   CSN: [id number] Arrival date & time: 10/02/22  0106     History  Chief Complaint  Patient presents with   Rectal Pain    Laura Lynch is a 26 y.o. female.  The history is provided by the patient.  Laura Lynch is a 26 y.o. female who presents to the Emergency Department complaining of rash.  She reports rash that started Friday on buttocks.  Rash is itchy and pain.    No fever.  No N/V.  Has dysuria for one week.  Had AP last week, none this week.    No meds.  No medical problems.  Had unprotected sex last week.      Home Medications Prior to Admission medications   Medication Sig Start Date End Date Taking? Authorizing Provider  acyclovir (ZOVIRAX) 400 MG tablet Take 1 tablet (400 mg total) by mouth 3 (three) times daily for 7 days. 10/02/22 10/09/22 Yes Tilden Fossa, MD  doxycycline (VIBRAMYCIN) 100 MG capsule Take 1 capsule (100 mg total) by mouth 2 (two) times daily for 7 days. 09/27/22 10/04/22  Long, Arlyss Repress, MD  hydrocortisone (ANUSOL-HC) 2.5 % rectal cream Place 1 Application rectally 2 (two) times daily for 7 days. 09/27/22 10/04/22  Long, Arlyss Repress, MD  ibuprofen (ADVIL) 800 MG tablet Take 1 tablet (800 mg total) by mouth 3 (three) times daily. 09/12/22   Cristopher Peru, PA-C  ibuprofen (ADVIL,MOTRIN) 800 MG tablet Take 1 tablet (800 mg total) by mouth 3 (three) times daily. 05/30/18   Muthersbaugh, Dahlia Client, PA-C  megestrol (MEGACE) 40 MG tablet TAKE 1 TABLET(40 MG) BY MOUTH TWICE DAILY 06/21/19   Dove, Myra C, MD  megestrol (MEGACE) 40 MG tablet TAKE 1 TABLET(40 MG) BY MOUTH TWICE DAILY 06/21/19   Allie Bossier, MD  norgestrel-ethinyl estradiol (LO/OVRAL,CRYSELLE) 0.3-30 MG-MCG tablet Take 1 tablet by mouth daily. Patient not taking: Reported on 05/29/2018 04/30/18   Allie Bossier, MD  ondansetron (ZOFRAN ODT) 4 MG disintegrating tablet 4mg  ODT q4 hours prn nausea/vomit 05/30/18   Muthersbaugh, 06/01/18, PA-C       Allergies    Patient has no known allergies.    Review of Systems   Review of Systems  All other systems reviewed and are negative.   Physical Exam Updated Vital Signs BP 131/89 (BP Location: Right Arm)   Pulse 86   Temp 98.2 F (36.8 C) (Oral)   Resp 18   Ht 5\' 6"  (1.676 m)   Wt 127 kg   LMP 09/10/2022 (Exact Date)   SpO2 100%   BMI 45.19 kg/m  Physical Exam Vitals and nursing note reviewed.  Constitutional:      Appearance: She is well-developed.  HENT:     Head: Normocephalic and atraumatic.  Cardiovascular:     Rate and Rhythm: Normal rate and regular rhythm.  Pulmonary:     Effort: Pulmonary effort is normal. No respiratory distress.  Genitourinary:    Comments: There are scattered ulcerative lesions that are 1 to 2 mm in diameter in the inguinal region bilaterally.  There is 1 ulcerative lesion to the gluteal crease. Musculoskeletal:        General: No tenderness.  Skin:    General: Skin is warm and dry.  Neurological:     Mental Status: She is alert and oriented to person, place, and time.  Psychiatric:        Behavior: Behavior normal.     ED  Results / Procedures / Treatments   Labs (all labs ordered are listed, but only abnormal results are displayed) Labs Reviewed  HSV 1/2 PCR (SURFACE)    EKG None  Radiology No results found.  Procedures Procedures    Medications Ordered in ED Medications - No data to display  ED Course/ Medical Decision Making/ A&P                           Medical Decision Making Risk Prescription drug management.   Patient here for evaluation of itchy and painful rash, did have unprotected intercourse 1 week ago.  She was evaluated in the emergency department on November 16 and had negative GC chlamydia, UA and pregnancy test at that time.  Rash today may be secondary to local irritation versus HSV.  Culture was sent.  Will start empiric treatment.  Patient does report urinary symptoms-had a UA 1 week ago,  which was normal.  Patient declines additional urinalysis at this time.  Discussed outpatient follow-up for recheck.       Final Clinical Impression(s) / ED Diagnoses Final diagnoses:  Genital ulcer, female    Rx / DC Orders ED Discharge Orders          Ordered    acyclovir (ZOVIRAX) 400 MG tablet  3 times daily        10/02/22 0409              Tilden Fossa, MD 10/02/22 [phone]

## 2022-10-03 LAB — HSV 1/2 PCR (SURFACE)
HSV-1 DNA: DETECTED — AB
HSV-2 DNA: NOT DETECTED — AB

## 2023-02-18 ENCOUNTER — Other Ambulatory Visit: Payer: Self-pay

## 2023-02-18 ENCOUNTER — Encounter (HOSPITAL_BASED_OUTPATIENT_CLINIC_OR_DEPARTMENT_OTHER): Payer: Self-pay | Admitting: Emergency Medicine

## 2023-02-18 DIAGNOSIS — Z20822 Contact with and (suspected) exposure to covid-19: Secondary | ICD-10-CM | POA: Insufficient documentation

## 2023-02-18 DIAGNOSIS — R051 Acute cough: Secondary | ICD-10-CM | POA: Insufficient documentation

## 2023-02-18 DIAGNOSIS — R0981 Nasal congestion: Secondary | ICD-10-CM | POA: Insufficient documentation

## 2023-02-18 NOTE — ED Triage Notes (Signed)
Pt POV c/o "asthma acting up."  Reports cough, sneezing, congestion x2 weeks, worse at night. Denies swelling.  Reports OTC meds ineffective.  Pt breathing unlabored, equal.  Speaking in full sentences.

## 2023-02-19 ENCOUNTER — Emergency Department (HOSPITAL_BASED_OUTPATIENT_CLINIC_OR_DEPARTMENT_OTHER)
Admission: EM | Admit: 2023-02-19 | Discharge: 2023-02-19 | Disposition: A | Discharge status: Home / Self Care | Payer: Self-pay | Attending: Emergency Medicine | Admitting: Emergency Medicine

## 2023-02-19 DIAGNOSIS — T7840XA Allergy, unspecified, initial encounter: Secondary | ICD-10-CM

## 2023-02-19 DIAGNOSIS — R051 Acute cough: Secondary | ICD-10-CM

## 2023-02-19 LAB — RESP PANEL BY RT-PCR (RSV, FLU A&B, COVID)  RVPGX2
Influenza A by PCR: NEGATIVE
Influenza B by PCR: NEGATIVE
Resp Syncytial Virus by PCR: NEGATIVE
SARS Coronavirus 2 by RT PCR: NEGATIVE

## 2023-02-19 MED ORDER — ALBUTEROL SULFATE HFA 108 (90 BASE) MCG/ACT IN AERS: 1.0000 | INHALATION_SPRAY | Freq: Four times a day (QID) | RESPIRATORY_TRACT | 0 refills | Status: AC | PRN

## 2023-02-19 MED ORDER — PREDNISONE 10 MG PO TABS: 20.0000 mg | ORAL_TABLET | Freq: Every day | ORAL | 0 refills | Status: AC

## 2023-02-19 MED ORDER — PREDNISONE 50 MG PO TABS
60.0000 mg | ORAL_TABLET | Freq: Once | ORAL | Status: DC
  Filled 2023-02-19: qty 1

## 2023-02-19 MED ORDER — LORATADINE 10 MG PO TABS
10.0000 mg | ORAL_TABLET | Freq: Once | ORAL | Status: DC
  Filled 2023-02-19: qty 1

## 2023-02-19 NOTE — ED Provider Notes (Signed)
[place] EMERGENCY DE[facility]MENT AT [facility] [place] Provider Note   CSN: [id number] Arrival date & time: 02/18/23  2303     History  No chief complaint on file.   Laura Lynch is a 27 y.o. female.  The history is provided by the patient.  URI Presenting symptoms: congestion and cough   Presenting symptoms: no fever   Presenting symptoms comment:  Sneezing Onset quality:  Gradual Duration:  2 weeks Timing:  Constant Progression:  Unchanged Chronicity:  New Worsened by:  Nothing Ineffective treatments:  None tried Associated symptoms: sneezing   Associated symptoms: no arthralgias   Risk factors: not elderly        Home Medications Prior to Admission medications   Medication Sig Start Date End Date Taking? Authorizing Provider  albuterol (VENTOLIN HFA) 108 (90 Base) MCG/ACT inhaler Inhale 1-2 puffs into the lungs every 6 (six) hours as needed for wheezing or shortness of breath. 02/19/23  Yes Corran Lalone, MD  predniSONE (DELTASONE) 10 MG tablet Take 2 tablets (20 mg total) by mouth daily. 02/19/23  Yes Rodricus Candelaria, MD  ibuprofen (ADVIL) 800 MG tablet Take 1 tablet (800 mg total) by mouth 3 (three) times daily. 09/12/22   Cristopher Peru, PA-C  ibuprofen (ADVIL,MOTRIN) 800 MG tablet Take 1 tablet (800 mg total) by mouth 3 (three) times daily. 05/30/18   Muthersbaugh, Dahlia Client, PA-C  megestrol (MEGACE) 40 MG tablet TAKE 1 TABLET(40 MG) BY MOUTH TWICE DAILY 06/21/19   Dove, Myra C, MD  megestrol (MEGACE) 40 MG tablet TAKE 1 TABLET(40 MG) BY MOUTH TWICE DAILY 06/21/19   Allie Bossier, MD  norgestrel-ethinyl estradiol (LO/OVRAL,CRYSELLE) 0.3-30 MG-MCG tablet Take 1 tablet by mouth daily. Patient not taking: Reported on 05/29/2018 04/30/18   Allie Bossier, MD  ondansetron (ZOFRAN ODT) 4 MG disintegrating tablet 4mg  ODT q4 hours prn nausea/vomit 05/30/18   Muthersbaugh, Dahlia Client, PA-C      Allergies    Patient has no known allergies.    Review of Systems   Review of Systems   Constitutional:  Negative for fever.  HENT:  Positive for congestion and sneezing.   Eyes:  Negative for redness.  Respiratory:  Positive for cough.   Musculoskeletal:  Negative for arthralgias.  All other systems reviewed and are negative.   Physical Exam Updated Vital Signs BP (!) 134/93 (BP Location: Left Arm)   Pulse 82   Temp 98.3 F (36.8 C) (Oral)   Resp 18   Ht 5\' 5"  (1.651 m)   Wt 127 kg   LMP 02/11/2023 (Approximate)   SpO2 100%   BMI 46.59 kg/m  Physical Exam Vitals and nursing note reviewed.  Constitutional:      General: She is not in acute distress.    Appearance: Normal appearance. She is well-developed.  HENT:     Head: Normocephalic and atraumatic.     Nose: Congestion present.  Eyes:     Pupils: Pupils are equal, round, and reactive to light.  Cardiovascular:     Rate and Rhythm: Normal rate and regular rhythm.     Pulses: Normal pulses.     Heart sounds: Normal heart sounds.  Pulmonary:     Effort: Pulmonary effort is normal. No respiratory distress.     Breath sounds: Normal breath sounds. No wheezing or rhonchi.  Abdominal:     General: Bowel sounds are normal. There is no distension.     Palpations: Abdomen is soft.     Tenderness: There is  no abdominal tenderness. There is no guarding or rebound.  Genitourinary:    Vagina: No vaginal discharge.  Musculoskeletal:        General: Normal range of motion.     Cervical back: Normal range of motion and neck supple.  Skin:    General: Skin is warm and dry.     Capillary Refill: Capillary refill takes less than 2 seconds.     Findings: No erythema or rash.  Neurological:     General: No focal deficit present.     Mental Status: She is alert.     Deep Tendon Reflexes: Reflexes normal.  Psychiatric:        Mood and Affect: Mood normal.     ED Results / Procedures / Treatments   Labs (all labs ordered are listed, but only abnormal results are displayed) Labs Reviewed  RESP PANEL BY RT-PCR  (RSV, FLU A&B, COVID)  [id number]    EKG None  Radiology No results found.  Procedures Procedures    Medications Ordered in ED Medications  loratadine (CLARITIN) tablet 10 mg (has no administration in time range)  predniSONE (DELTASONE) tablet 60 mg (has no administration in time range)    ED Course/ Medical Decision Making/ A&P                             Medical Decision Making Patient with 2 weeks of sneezing and cough and congestion.  States she is wheezing   Amount and/or Complexity of Data Reviewed External Data Reviewed: notes.    Details: Previous notes reviewed   Risk OTC drugs. Prescription drug management. Risk Details: Cough congestion and sneezing x 2 weeks.  Symptoms are consistent with seasonal allergies.  Patient states she is currently wheezing but there are no wheezing at this time.  Start claritin will do steroids and refill patient's inhaler, stable for discharge.  Strict return.      Final Clinical Impression(s) / ED Diagnoses Final diagnoses:  Acute cough   Return for intractable cough, coughing up blood, fevers > 100.4 unrelieved by medication, shortness of breath, intractable vomiting, chest pain, shortness of breath, weakness, numbness, changes in speech, facial asymmetry, abdominal pain, passing out, Inability to tolerate liquids or food, cough, altered mental status or any concerns. No signs of systemic illness or infection. The patient is nontoxic-appearing on exam and vital signs are within normal limits.  I have reviewed the triage vital signs and the nursing notes. Pertinent labs & imaging results that were available during my care of the patient were reviewed by me and considered in my medical decision making (see chart for details). After history, exam, and medical workup I feel the patient has been appropriately medically screened and is safe for discharge home. Pertinent diagnoses were discussed with the patient. Patient was given return  precautions.  Rx / DC Orders ED Discharge Orders          Ordered    predniSONE (DELTASONE) 10 MG tablet  Daily        02/19/23 0104    albuterol (VENTOLIN HFA) 108 (90 Base) MCG/ACT inhaler  Every 6 hours PRN        02/19/23 0104              Calena Salem, MD 02/19/23 [id number]

## 2023-09-16 ENCOUNTER — Other Ambulatory Visit: Payer: Self-pay

## 2023-12-24 ENCOUNTER — Encounter (HOSPITAL_BASED_OUTPATIENT_CLINIC_OR_DEPARTMENT_OTHER): Payer: Self-pay | Admitting: Radiology

## 2023-12-24 ENCOUNTER — Emergency Department (HOSPITAL_BASED_OUTPATIENT_CLINIC_OR_DEPARTMENT_OTHER)
Admission: EM | Admit: 2023-12-24 | Discharge: 2023-12-25 | Disposition: A | Discharge status: Home / Self Care | Payer: Medicaid Other | Attending: Emergency Medicine | Admitting: Emergency Medicine

## 2023-12-24 ENCOUNTER — Other Ambulatory Visit: Payer: Self-pay

## 2023-12-24 DIAGNOSIS — Z202 Contact with and (suspected) exposure to infections with a predominantly sexual mode of transmission: Secondary | ICD-10-CM | POA: Insufficient documentation

## 2023-12-24 LAB — WET PREP, GENITAL
Clue Cells Wet Prep HPF POC: NONE SEEN
Sperm: NONE SEEN
Trich, Wet Prep: NONE SEEN
WBC, Wet Prep HPF POC: <10 (ref <10)
Yeast Wet Prep HPF POC: NONE SEEN

## 2023-12-24 MED ORDER — DOXYCYCLINE HYCLATE 100 MG PO CAPS: 100.0000 mg | ORAL_CAPSULE | Freq: Two times a day (BID) | ORAL | 0 refills | Status: AC

## 2023-12-24 MED ORDER — CEFTRIAXONE SODIUM 500 MG IJ SOLR
500.0000 mg | Freq: Once | INTRAMUSCULAR | Status: AC
  Administered 2023-12-25: 500 mg via INTRAMUSCULAR
  Filled 2023-12-24: qty 500

## 2023-12-24 NOTE — ED Provider Notes (Signed)
[place] [facility] [place] Provider Note   CSN: [id number] Arrival date & time: 12/24/23  2242     History  Chief Complaint  Patient presents with   Exposure to STD    Laura Lynch is a 28 y.o. female.  Patient reports that she was called by the health department and told that she had her name was given as a contact to somebody that tested positive for chlamydia and gonorrhea.  She reports that she does have some mild vaginal irritation but no discharge, no pelvic pain.       Home Medications Prior to Admission medications   Medication Sig Start Date End Date Taking? Authorizing Provider  doxycycline (VIBRAMYCIN) 100 MG capsule Take 1 capsule (100 mg total) by mouth 2 (two) times daily. 12/24/23  Yes Tykel Badie, Canary Brim, MD  albuterol (VENTOLIN HFA) 108 (90 Base) MCG/ACT inhaler Inhale 1-2 puffs into the lungs every 6 (six) hours as needed for wheezing or shortness of breath. 02/19/23   Palumbo, April, MD  ibuprofen (ADVIL) 800 MG tablet Take 1 tablet (800 mg total) by mouth 3 (three) times daily. 09/12/22   Cristopher Peru, PA-C  ibuprofen (ADVIL,MOTRIN) 800 MG tablet Take 1 tablet (800 mg total) by mouth 3 (three) times daily. 05/30/18   Muthersbaugh, Dahlia Client, PA-C  megestrol (MEGACE) 40 MG tablet TAKE 1 TABLET(40 MG) BY MOUTH TWICE DAILY 06/21/19   Dove, Myra C, MD  megestrol (MEGACE) 40 MG tablet TAKE 1 TABLET(40 MG) BY MOUTH TWICE DAILY 06/21/19   Allie Bossier, MD  norgestrel-ethinyl estradiol (LO/OVRAL,CRYSELLE) 0.3-30 MG-MCG tablet Take 1 tablet by mouth daily. Patient not taking: Reported on 05/29/2018 04/30/18   Allie Bossier, MD  ondansetron (ZOFRAN ODT) 4 MG disintegrating tablet 4mg  ODT q4 hours prn nausea/vomit 05/30/18   Muthersbaugh, Dahlia Client, PA-C  predniSONE (DELTASONE) 10 MG tablet Take 2 tablets (20 mg total) by mouth daily. 02/19/23   Palumbo, April, MD      Allergies    Patient has no known allergies.    Review of Systems   Review  of Systems  Physical Exam Updated Vital Signs BP 131/81 (BP Location: Left Arm)   Pulse 77   Temp 97.7 F (36.5 C)   Resp 18   Ht 5\' 5"  (1.651 m)   Wt 136.1 kg   SpO2 95%   BMI 49.92 kg/m  Physical Exam Vitals and nursing note reviewed.  Constitutional:      General: She is not in acute distress.    Appearance: She is well-developed.  HENT:     Head: Normocephalic and atraumatic.     Mouth/Throat:     Mouth: Mucous membranes are moist.  Eyes:     General: Vision grossly intact. Gaze aligned appropriately.     Extraocular Movements: Extraocular movements intact.     Conjunctiva/sclera: Conjunctivae normal.  Cardiovascular:     Rate and Rhythm: Normal rate and regular rhythm.     Pulses: Normal pulses.     Heart sounds: Normal heart sounds, S1 normal and S2 normal. No murmur heard.    No friction rub. No gallop.  Pulmonary:     Effort: Pulmonary effort is normal. No respiratory distress.     Breath sounds: Normal breath sounds.  Abdominal:     General: Bowel sounds are normal.     Palpations: Abdomen is soft.     Tenderness: There is no abdominal tenderness. There is no guarding or rebound.  Hernia: No hernia is present.  Musculoskeletal:        General: No swelling.     Cervical back: Full passive range of motion without pain, normal range of motion and neck supple. No spinous process tenderness or muscular tenderness. Normal range of motion.     Right lower leg: No edema.     Left lower leg: No edema.  Skin:    General: Skin is warm and dry.     Capillary Refill: Capillary refill takes less than 2 seconds.     Findings: No ecchymosis, erythema, rash or wound.  Neurological:     General: No focal deficit present.     Mental Status: She is alert and oriented to person, place, and time.     GCS: GCS eye subscore is 4. GCS verbal subscore is 5. GCS motor subscore is 6.     Cranial Nerves: Cranial nerves 2-12 are intact.     Sensory: Sensation is intact.      Motor: Motor function is intact.     Coordination: Coordination is intact.  Psychiatric:        Attention and Perception: Attention normal.        Mood and Affect: Mood normal.        Speech: Speech normal.        Behavior: Behavior normal.     ED Results / Procedures / Treatments   Labs (all labs ordered are listed, but only abnormal results are displayed) Labs Reviewed  WET PREP, GENITAL  PREGNANCY, URINE  GC/CHLAMYDIA PROBE AMP ([place]) NOT AT [facility]    EKG None  Radiology No results found.  Procedures Procedures    Medications Ordered in ED Medications  cefTRIAXone (ROCEPHIN) injection 500 mg (has no administration in time range)  lidocaine (PF) (XYLOCAINE) 1 % injection (has no administration in time range)    ED Course/ Medical Decision Making/ A&P                                 Medical Decision Making Amount and/or Complexity of Data Reviewed Labs: ordered.  Risk Prescription drug management.   Patient with what sounds like contact to gonorrhea and chlamydia, requested testing which was collected.  Empirically treated.        Final Clinical Impression(s) / ED Diagnoses Final diagnoses:  STD exposure    Rx / DC Orders ED Discharge Orders          Ordered    doxycycline (VIBRAMYCIN) 100 MG capsule  2 times daily        12/24/23 2350              Gilda Crease, MD 12/25/23 [phone]

## 2023-12-24 NOTE — ED Triage Notes (Signed)
Pt states she got a call from the department of public health stating she had been put down as a person that had contact with someone who tested positive for chlamydia and gonorrhea. Pt states she doesn't know if the call was a prank. Pt also requesting to be checked for yeast, BV, and trichomonas.

## 2023-12-24 NOTE — Discharge Instructions (Signed)
We did not see any trichomonas, yeast or bacterial vaginosis on your wet prep.  Will treat you for gonorrhea and chlamydia.

## 2023-12-24 NOTE — ED Provider Notes (Incomplete)
[place] [facility] [place] Provider Note   CSN: [id number] Arrival date & time: 12/24/23  2242     History {Add pertinent medical, surgical, social history, OB history to HPI:1} Chief Complaint  Patient presents with  . Exposure to STD    Laura Lynch is a 28 y.o. female.  Patient reports that she was called by the health department and told that she had her name was given as a contact to somebody that tested positive for chlamydia and gonorrhea.  She reports that she does have some mild vaginal irritation but no discharge, no pelvic pain.       Home Medications Prior to Admission medications   Medication Sig Start Date End Date Taking? Authorizing Provider  albuterol (VENTOLIN HFA) 108 (90 Base) MCG/ACT inhaler Inhale 1-2 puffs into the lungs every 6 (six) hours as needed for wheezing or shortness of breath. 02/19/23   Palumbo, April, MD  ibuprofen (ADVIL) 800 MG tablet Take 1 tablet (800 mg total) by mouth 3 (three) times daily. 09/12/22   Cristopher Peru, PA-C  ibuprofen (ADVIL,MOTRIN) 800 MG tablet Take 1 tablet (800 mg total) by mouth 3 (three) times daily. 05/30/18   Muthersbaugh, Dahlia Client, PA-C  megestrol (MEGACE) 40 MG tablet TAKE 1 TABLET(40 MG) BY MOUTH TWICE DAILY 06/21/19   Dove, Myra C, MD  megestrol (MEGACE) 40 MG tablet TAKE 1 TABLET(40 MG) BY MOUTH TWICE DAILY 06/21/19   Allie Bossier, MD  norgestrel-ethinyl estradiol (LO/OVRAL,CRYSELLE) 0.3-30 MG-MCG tablet Take 1 tablet by mouth daily. Patient not taking: Reported on 05/29/2018 04/30/18   Allie Bossier, MD  ondansetron (ZOFRAN ODT) 4 MG disintegrating tablet 4mg  ODT q4 hours prn nausea/vomit 05/30/18   Muthersbaugh, Dahlia Client, PA-C  predniSONE (DELTASONE) 10 MG tablet Take 2 tablets (20 mg total) by mouth daily. 02/19/23   Palumbo, April, MD      Allergies    Patient has no known allergies.    Review of Systems   Review of Systems  Physical Exam Updated Vital Signs BP 131/81 (BP  Location: Left Arm)   Pulse 77   Temp 97.7 F (36.5 C)   Resp 18   Ht 5\' 5"  (1.651 m)   Wt 136.1 kg   SpO2 95%   BMI 49.92 kg/m  Physical Exam Vitals and nursing note reviewed.  Constitutional:      General: She is not in acute distress.    Appearance: She is well-developed.  HENT:     Head: Normocephalic and atraumatic.     Mouth/Throat:     Mouth: Mucous membranes are moist.  Eyes:     General: Vision grossly intact. Gaze aligned appropriately.     Extraocular Movements: Extraocular movements intact.     Conjunctiva/sclera: Conjunctivae normal.  Cardiovascular:     Rate and Rhythm: Normal rate and regular rhythm.     Pulses: Normal pulses.     Heart sounds: Normal heart sounds, S1 normal and S2 normal. No murmur heard.    No friction rub. No gallop.  Pulmonary:     Effort: Pulmonary effort is normal. No respiratory distress.     Breath sounds: Normal breath sounds.  Abdominal:     General: Bowel sounds are normal.     Palpations: Abdomen is soft.     Tenderness: There is no abdominal tenderness. There is no guarding or rebound.     Hernia: No hernia is present.  Musculoskeletal:        General:  No swelling.     Cervical back: Full passive range of motion without pain, normal range of motion and neck supple. No spinous process tenderness or muscular tenderness. Normal range of motion.     Right lower leg: No edema.     Left lower leg: No edema.  Skin:    General: Skin is warm and dry.     Capillary Refill: Capillary refill takes less than 2 seconds.     Findings: No ecchymosis, erythema, rash or wound.  Neurological:     General: No focal deficit present.     Mental Status: She is alert and oriented to person, place, and time.     GCS: GCS eye subscore is 4. GCS verbal subscore is 5. GCS motor subscore is 6.     Cranial Nerves: Cranial nerves 2-12 are intact.     Sensory: Sensation is intact.     Motor: Motor function is intact.     Coordination: Coordination is  intact.  Psychiatric:        Attention and Perception: Attention normal.        Mood and Affect: Mood normal.        Speech: Speech normal.        Behavior: Behavior normal.     ED Results / Procedures / Treatments   Labs (all labs ordered are listed, but only abnormal results are displayed) Labs Reviewed  WET PREP, GENITAL  GC/CHLAMYDIA PROBE AMP ([place]) NOT AT [facility]    EKG None  Radiology No results found.  Procedures Procedures  {Document cardiac monitor, telemetry assessment procedure when appropriate:1}  Medications Ordered in ED Medications  cefTRIAXone (ROCEPHIN) injection 500 mg (has no administration in time range)    ED Course/ Medical Decision Making/ A&P   {   Click here for ABCD2, HEART and other calculatorsREFRESH Note before signing :1}                              Medical Decision Making Amount and/or Complexity of Data Reviewed Labs: ordered.  Risk Prescription drug management.   Patient with what sounds like contact to gonorrhea and chlamydia, requested testing which was collected.  Empirically treated.  {Document critical care time when appropriate:1} {Document review of labs and clinical decision tools ie heart score, Chads2Vasc2 etc:1}  {Document your independent review of radiology images, and any outside records:1} {Document your discussion with family members, caretakers, and with consultants:1} {Document social determinants of health affecting pt's care:1} {Document your decision making why or why not admission, treatments were needed:1} Final Clinical Impression(s) / ED Diagnoses Final diagnoses:  None    Rx / DC Orders ED Discharge Orders     None

## 2023-12-25 LAB — PREGNANCY, URINE: Preg Test, Ur: NEGATIVE

## 2023-12-25 MED ORDER — LIDOCAINE HCL (PF) 1 % IJ SOLN
INTRAMUSCULAR | Status: AC
  Administered 2023-12-25: 5 mL
  Filled 2023-12-25: qty 5

## 2023-12-26 LAB — GC/CHLAMYDIA PROBE AMP (~~LOC~~) NOT AT ARMC
Chlamydia: NEGATIVE
Comment: NEGATIVE
Comment: NORMAL
Neisseria Gonorrhea: NEGATIVE

## 2024-09-06 ENCOUNTER — Encounter (HOSPITAL_BASED_OUTPATIENT_CLINIC_OR_DEPARTMENT_OTHER): Payer: Self-pay | Admitting: Emergency Medicine

## 2024-09-06 ENCOUNTER — Other Ambulatory Visit: Payer: Self-pay

## 2024-09-06 ENCOUNTER — Emergency Department (HOSPITAL_BASED_OUTPATIENT_CLINIC_OR_DEPARTMENT_OTHER)
Admission: EM | Admit: 2024-09-06 | Discharge: 2024-09-06 | Disposition: A | Discharge status: Home / Self Care | Attending: Emergency Medicine | Admitting: Emergency Medicine

## 2024-09-06 ENCOUNTER — Emergency Department (HOSPITAL_BASED_OUTPATIENT_CLINIC_OR_DEPARTMENT_OTHER)

## 2024-09-06 DIAGNOSIS — Z79899 Other long term (current) drug therapy: Secondary | ICD-10-CM | POA: Diagnosis not present

## 2024-09-06 DIAGNOSIS — K659 Peritonitis, unspecified: Secondary | ICD-10-CM | POA: Insufficient documentation

## 2024-09-06 DIAGNOSIS — D72829 Elevated white blood cell count, unspecified: Secondary | ICD-10-CM | POA: Insufficient documentation

## 2024-09-06 DIAGNOSIS — K6389 Other specified diseases of intestine: Secondary | ICD-10-CM

## 2024-09-06 DIAGNOSIS — R1032 Left lower quadrant pain: Secondary | ICD-10-CM | POA: Diagnosis present

## 2024-09-06 LAB — CBC WITH DIFFERENTIAL/PLATELET
Abs Immature Granulocytes: 0.07 K/uL (ref 0.00–0.07)
Basophils Absolute: 0.1 K/uL (ref 0.0–0.1)
Basophils Relative: 1 %
Eosinophils Absolute: 0.3 K/uL (ref 0.0–0.5)
Eosinophils Relative: 2 %
HCT: 41.8 % (ref 36.0–46.0)
Hemoglobin: 14 g/dL (ref 12.0–15.0)
Immature Granulocytes: 1 %
Lymphocytes Relative: 30 %
Lymphs Abs: 3.9 K/uL (ref 0.7–4.0)
MCH: 30.8 pg (ref 26.0–34.0)
MCHC: 33.5 g/dL (ref 30.0–36.0)
MCV: 92.1 fL (ref 80.0–100.0)
Monocytes Absolute: 0.9 K/uL (ref 0.1–1.0)
Monocytes Relative: 7 %
Neutro Abs: 7.9 K/uL — ABNORMAL HIGH (ref 1.7–7.7)
Neutrophils Relative %: 59 %
Platelets: 257 K/uL (ref 150–400)
RBC: 4.54 MIL/uL (ref 3.87–5.11)
RDW: 14.3 % (ref 11.5–15.5)
WBC: 13 K/uL — ABNORMAL HIGH (ref 4.0–10.5)
nRBC: 0 % (ref 0.0–0.2)

## 2024-09-06 LAB — COMPREHENSIVE METABOLIC PANEL WITH GFR
ALT: 13 U/L (ref 0–44)
AST: 16 U/L (ref 15–41)
Albumin: 4 g/dL (ref 3.5–5.0)
Alkaline Phosphatase: 87 U/L (ref 38–126)
Anion gap: 11 (ref 5–15)
BUN: 10 mg/dL (ref 6–20)
CO2: 24 mmol/L (ref 22–32)
Calcium: 9 mg/dL (ref 8.9–10.3)
Chloride: 105 mmol/L (ref 98–111)
Creatinine, Ser: 0.84 mg/dL (ref 0.44–1.00)
GFR, Estimated: >60 mL/min (ref >60)
Glucose, Bld: 99 mg/dL (ref 70–99)
Potassium: 3.9 mmol/L (ref 3.5–5.1)
Sodium: 139 mmol/L (ref 135–145)
Total Bilirubin: 0.2 mg/dL (ref 0.0–1.2)
Total Protein: 7.5 g/dL (ref 6.5–8.1)

## 2024-09-06 LAB — URINALYSIS, W/ REFLEX TO CULTURE (INFECTION SUSPECTED)
Bilirubin Urine: NEGATIVE
Glucose, UA: NEGATIVE mg/dL
Hgb urine dipstick: NEGATIVE
Ketones, ur: NEGATIVE mg/dL
Leukocytes,Ua: NEGATIVE
Nitrite: NEGATIVE
Protein, ur: NEGATIVE mg/dL
Specific Gravity, Urine: >=1.03 (ref 1.005–1.030)
pH: 6 (ref 5.0–8.0)

## 2024-09-06 LAB — HCG, SERUM, QUALITATIVE: Preg, Serum: NEGATIVE

## 2024-09-06 LAB — LIPASE, BLOOD: Lipase: 30 U/L (ref 11–51)

## 2024-09-06 MED ORDER — IOHEXOL 300 MG/ML  SOLN
125.0000 mL | Freq: Once | INTRAMUSCULAR | Status: AC | PRN
  Administered 2024-09-06: 125 mL via INTRAVENOUS

## 2024-09-06 MED ORDER — OXYCODONE-ACETAMINOPHEN 5-325 MG PO TABS: 1.0000 | ORAL_TABLET | Freq: Four times a day (QID) | ORAL | 0 refills | Status: AC | PRN

## 2024-09-06 MED ORDER — MORPHINE SULFATE (PF) 4 MG/ML IV SOLN
4.0000 mg | Freq: Once | INTRAVENOUS | Status: AC
  Administered 2024-09-06: 4 mg via INTRAVENOUS
  Filled 2024-09-06: qty 1

## 2024-09-06 MED ORDER — ONDANSETRON HCL 4 MG/2ML IJ SOLN
4.0000 mg | Freq: Once | INTRAMUSCULAR | Status: AC
  Administered 2024-09-06: 4 mg via INTRAVENOUS
  Filled 2024-09-06: qty 2

## 2024-09-06 MED ORDER — ONDANSETRON 4 MG PO TBDP: 4.0000 mg | ORAL_TABLET | Freq: Three times a day (TID) | ORAL | 0 refills | Status: AC | PRN

## 2024-09-06 NOTE — ED Provider Notes (Signed)
 [place] [facility] Provider Note   CSN: [id number] Arrival date & time: 09/06/24  2120    Patient presents with: Abdominal Pain   Laura Lynch is a 28 y.o. female here for evaluation of left-sided abdominal pain and over the last 4 days.  Associated diarrhea.  Pain located to left mid and left lower quadrant.  No bloody stool.  Some nausea without vomiting.  No fever, chest pain, shortness of breath, back pain, dysuria, hematuria.  No pelvic pain, vaginal discharge concern for STI.    HPI     Prior to Admission medications   Medication Sig Start Date End Date Taking? Authorizing Provider  ondansetron  (ZOFRAN -ODT) 4 MG disintegrating tablet Take 1 tablet (4 mg total) by mouth every 8 (eight) hours as needed. 09/06/24  Yes Riverlyn Kizziah A, PA-C  oxyCODONE-acetaminophen (PERCOCET/ROXICET) 5-325 MG tablet Take 1 tablet by mouth every 6 (six) hours as needed for severe pain (pain score 7-10). 09/06/24  Yes Karson Chicas A, PA-C  albuterol  (VENTOLIN  HFA) 108 (90 Base) MCG/ACT inhaler Inhale 1-2 puffs into the lungs every 6 (six) hours as needed for wheezing or shortness of breath. 02/19/23   Palumbo, April, MD  doxycycline  (VIBRAMYCIN ) 100 MG capsule Take 1 capsule (100 mg total) by mouth 2 (two) times daily. 12/24/23   Haze Lonni PARAS, MD  ibuprofen  (ADVIL ) 800 MG tablet Take 1 tablet (800 mg total) by mouth 3 (three) times daily. 09/12/22   Adolph Tinnie FORBES, PA-C  ibuprofen  (ADVIL ,MOTRIN ) 800 MG tablet Take 1 tablet (800 mg total) by mouth 3 (three) times daily. 05/30/18   Muthersbaugh, Chiquita, PA-C  megestrol  (MEGACE ) 40 MG tablet TAKE 1 TABLET(40 MG) BY MOUTH TWICE DAILY 06/21/19   Dove, Myra C, MD  megestrol  (MEGACE ) 40 MG tablet TAKE 1 TABLET(40 MG) BY MOUTH TWICE DAILY 06/21/19   Dove, Myra C, MD  norgestrel -ethinyl estradiol  (LO/OVRAL ,CRYSELLE ) 0.3-30 MG-MCG tablet Take 1 tablet by mouth daily. Patient not taking: Reported on 05/29/2018  04/30/18   Dove, Myra C, MD  predniSONE  (DELTASONE ) 10 MG tablet Take 2 tablets (20 mg total) by mouth daily. 02/19/23   Palumbo, April, MD    Allergies: Patient has no known allergies.    Review of Systems  Constitutional: Negative.   HENT: Negative.    Respiratory: Negative.    Cardiovascular: Negative.   Gastrointestinal:  Positive for abdominal pain, diarrhea and nausea. Negative for abdominal distention, anal bleeding, blood in stool, constipation, rectal pain and vomiting.  Genitourinary: Negative.   Musculoskeletal: Negative.   Skin: Negative.   Neurological: Negative.   All other systems reviewed and are negative.   Updated Vital Signs BP 130/78 (BP Location: Right Arm)   Pulse 93   Temp 98.3 F (36.8 C) (Oral)   Resp 20   LMP 09/01/2024   SpO2 98%   Physical Exam Vitals and nursing note reviewed.  Constitutional:      General: She is not in acute distress.    Appearance: She is well-developed. She is not ill-appearing, toxic-appearing or diaphoretic.  HENT:     Head: Atraumatic.  Eyes:     Pupils: Pupils are equal, round, and reactive to light.  Cardiovascular:     Rate and Rhythm: Normal rate.     Heart sounds: Normal heart sounds.  Pulmonary:     Effort: Pulmonary effort is normal. No respiratory distress.     Breath sounds: Normal breath sounds.  Abdominal:     General: There  is no distension.     Palpations: Abdomen is soft.     Tenderness: There is abdominal tenderness. There is no right CVA tenderness, left CVA tenderness, guarding or rebound.     Comments: Diffuse tenderness worse to left lower quadrant, left mid abdomen  Musculoskeletal:        General: Normal range of motion.     Cervical back: Normal range of motion.  Skin:    General: Skin is warm and dry.  Neurological:     General: No focal deficit present.     Mental Status: She is alert.  Psychiatric:        Mood and Affect: Mood normal.     (all labs ordered are listed, but only  abnormal results are displayed) Labs Reviewed  CBC WITH DIFFERENTIAL/PLATELET - Abnormal; Notable for the following components:      Result Value   WBC 13.0 (*)    Neutro Abs 7.9 (*)    All other components within normal limits  URINALYSIS, W/ REFLEX TO CULTURE (INFECTION SUSPECTED) - Abnormal; Notable for the following components:   Bacteria, UA RARE (*)    All other components within normal limits  COMPREHENSIVE METABOLIC PANEL WITH GFR  LIPASE, BLOOD  HCG, SERUM, QUALITATIVE    EKG: None  Radiology: CT ABDOMEN PELVIS W CONTRAST Result Date: 09/06/2024 EXAM: CT ABDOMEN AND PELVIS WITH CONTRAST 09/06/2024 10:34:01 PM TECHNIQUE: CT of the abdomen and pelvis was performed with the administration of 125 mL of iohexol (OMNIPAQUE) 300 MG/ML solution. Multiplanar reformatted images are provided for review. Automated exposure control, iterative reconstruction, and/or weight-based adjustment of the mA/kV was utilized to reduce the radiation dose to as low as reasonably achievable. COMPARISON: None available. CLINICAL HISTORY: LLQ abdominal pain. LLQ abdominal pain with nausea x 3 days. 125 mls Omni 300 IV. FINDINGS: LOWER CHEST: No acute abnormality. LIVER: The liver is unremarkable. GALLBLADDER AND BILE DUCTS: Gallbladder is unremarkable. No biliary ductal dilatation. SPLEEN: No acute abnormality. PANCREAS: No acute abnormality. ADRENAL GLANDS: No acute abnormality. KIDNEYS, URETERS AND BLADDER: No stones in the kidneys or ureters. No hydronephrosis. No perinephric or periureteral stranding. Urinary bladder is unremarkable. GI AND BOWEL: Stomach demonstrates no acute abnormality. There is no bowel obstruction. Appendix is normal. Oval area of fatty inflammation seen adjacent to the distal descending colon. PERITONEUM AND RETROPERITONEUM: No ascites. No free air. VASCULATURE: Aorta is normal in caliber. LYMPH NODES: No lymphadenopathy. REPRODUCTIVE ORGANS: No acute abnormality. BONES AND SOFT TISSUES:  No acute osseous abnormality. No focal soft tissue abnormality. IMPRESSION: 1. Distal descending colon epiploic appendagitis. Electronically signed by: Greig Pique MD 09/06/2024 10:45 PM EDT RP Workstation: [id number]     Procedures   Medications Ordered in the ED  morphine (PF) 4 MG/ML injection 4 mg (4 mg Intravenous Given 09/06/24 2146)  ondansetron  (ZOFRAN ) injection 4 mg (4 mg Intravenous Given 09/06/24 2146)  iohexol (OMNIPAQUE) 300 MG/ML solution 125 mL (125 mLs Intravenous Contrast Given 09/06/24 3929)   28 year old here for evaluation of left lower quadrant left mid abdominal pain, associate diarrhea and nausea over the last 4 days.  Here she is afebrile, nonseptic, non-ill-appearing.  She denies any bloody stool.  No back pain, urinary symptoms.  She denies any pelvic pain, vaginal discharge, concern for STI.  Will plan on pain management, labs, imaging, reassess  Labs and imaging personally viewed and interpreted:  CBC leukocytosis 13.0 CMP without significant abnormality Lipase 30 Preg neg UA negative for infection CT AP with epiploic appendagitis  Patient reassessed.  Discussed labs and imaging.  Pain controlled.  Tolerating p.o. intake.  Discussed CT findings.  Will DC home with symptomatic management   Patient is nontoxic, nonseptic appearing, in no apparent distress.  Patient's pain and other symptoms adequately managed in emergency department.  Fluid bolus given.  Labs, imaging and vitals reviewed.  Patient does not meet the SIRS or Sepsis criteria.  On repeat exam patient does not have a surgical abdomin and there are no peritoneal signs.  No indication of appendicitis, bowel obstruction, bowel perforation, cholecystitis, diverticulitis, PID, intermittent, persistent torsion, TOA, ectopic pregnancy, AAA, dissection, traumatic injury.  Patient discharged home with symptomatic treatment and given strict instructions for follow-up with their primary care physician.  I have  also discussed reasons to return immediately to the ER.  Patient expresses understanding and agrees with plan.                                    Medical Decision Making Amount and/or Complexity of Data Reviewed External Data Reviewed: labs, radiology and notes. Labs: ordered. Decision-making details documented in ED Course. Radiology: ordered and independent interpretation performed. Decision-making details documented in ED Course.  Risk OTC drugs. Prescription drug management. Parenteral controlled substances. Decision regarding hospitalization. Diagnosis or treatment significantly limited by social determinants of health.       Final diagnoses:  Epiploic appendagitis    ED Discharge Orders          Ordered    oxyCODONE-acetaminophen (PERCOCET/ROXICET) 5-325 MG tablet  Every 6 hours PRN        09/06/24 2257    ondansetron  (ZOFRAN -ODT) 4 MG disintegrating tablet  Every 8 hours PRN        09/06/24 2257               Tamarick Kovalcik A, PA-C 09/06/24 2259    Ruthe Cornet, DO 09/06/24 2309

## 2024-09-06 NOTE — Discharge Instructions (Addendum)
 It was a pleasure taking care of you here today  As we discussed you have epiploic appendagitis.  This is not infection.  It typically resolves with time.  We have written you for a short course of pain medicine and nausea medicine.  Please use caution as the pain medication is a narcotic prescription does have the addictive potential.  It may make you sleepy.  It does contain Tylenol so do not take any other Tylenol containing products while taking this medication.  You may take anti-inflammatories such as ibuprofen  or Aleve.  Let your primary care doctor know you were seen here today  Follow-up in 2 days for reevaluation  Return for new or worsening symptoms

## 2024-09-06 NOTE — ED Triage Notes (Signed)
 LLQ pain x 3 days, nausea.

## 2024-09-08 ENCOUNTER — Emergency Department (HOSPITAL_COMMUNITY)
Admission: EM | Admit: 2024-09-08 | Discharge: 2024-09-09 | Disposition: A | Discharge status: Home / Self Care | Attending: Emergency Medicine | Admitting: Emergency Medicine

## 2024-09-08 ENCOUNTER — Other Ambulatory Visit: Payer: Self-pay

## 2024-09-08 ENCOUNTER — Encounter (HOSPITAL_COMMUNITY): Payer: Self-pay

## 2024-09-08 DIAGNOSIS — K6389 Other specified diseases of intestine: Secondary | ICD-10-CM

## 2024-09-08 DIAGNOSIS — R109 Unspecified abdominal pain: Secondary | ICD-10-CM

## 2024-09-08 DIAGNOSIS — K388 Other specified diseases of appendix: Secondary | ICD-10-CM | POA: Insufficient documentation

## 2024-09-08 DIAGNOSIS — D72829 Elevated white blood cell count, unspecified: Secondary | ICD-10-CM | POA: Diagnosis not present

## 2024-09-08 DIAGNOSIS — R1032 Left lower quadrant pain: Secondary | ICD-10-CM | POA: Diagnosis present

## 2024-09-08 LAB — CBC
HCT: 42.4 % (ref 36.0–46.0)
Hemoglobin: 13.6 g/dL (ref 12.0–15.0)
MCH: 29.8 pg (ref 26.0–34.0)
MCHC: 32.1 g/dL (ref 30.0–36.0)
MCV: 92.8 fL (ref 80.0–100.0)
Platelets: 253 K/uL (ref 150–400)
RBC: 4.57 MIL/uL (ref 3.87–5.11)
RDW: 13.9 % (ref 11.5–15.5)
WBC: 11.4 K/uL — ABNORMAL HIGH (ref 4.0–10.5)
nRBC: 0 % (ref 0.0–0.2)

## 2024-09-08 LAB — COMPREHENSIVE METABOLIC PANEL WITH GFR
ALT: 13 U/L (ref 0–44)
AST: 15 U/L (ref 15–41)
Albumin: 4 g/dL (ref 3.5–5.0)
Alkaline Phosphatase: 84 U/L (ref 38–126)
Anion gap: 10 (ref 5–15)
BUN: 9 mg/dL (ref 6–20)
CO2: 25 mmol/L (ref 22–32)
Calcium: 9 mg/dL (ref 8.9–10.3)
Chloride: 105 mmol/L (ref 98–111)
Creatinine, Ser: 0.74 mg/dL (ref 0.44–1.00)
GFR, Estimated: >60 mL/min (ref >60)
Glucose, Bld: 115 mg/dL — ABNORMAL HIGH (ref 70–99)
Potassium: 3.8 mmol/L (ref 3.5–5.1)
Sodium: 140 mmol/L (ref 135–145)
Total Bilirubin: 0.3 mg/dL (ref 0.0–1.2)
Total Protein: 7.4 g/dL (ref 6.5–8.1)

## 2024-09-08 LAB — URINALYSIS, ROUTINE W REFLEX MICROSCOPIC
Bilirubin Urine: NEGATIVE
Glucose, UA: NEGATIVE mg/dL
Hgb urine dipstick: NEGATIVE
Ketones, ur: NEGATIVE mg/dL
Leukocytes,Ua: NEGATIVE
Nitrite: NEGATIVE
Protein, ur: NEGATIVE mg/dL
Specific Gravity, Urine: 1.031 — ABNORMAL HIGH (ref 1.005–1.030)
pH: 5 (ref 5.0–8.0)

## 2024-09-08 LAB — LIPASE, BLOOD: Lipase: 23 U/L (ref 11–51)

## 2024-09-08 LAB — HCG, SERUM, QUALITATIVE: Preg, Serum: NEGATIVE

## 2024-09-08 NOTE — ED Provider Notes (Signed)
 [place] [facility] AT [facility] Provider Note   CSN: [id number] Arrival date & time: 09/08/24  2211     Patient presents with: Abdominal Pain   Laura Lynch is a 28 y.o. female with a recent history of epiploic appendagitis presents to the ED with abdominal pain x 4 days. The symptoms started gradually and have been progressively worsening since onset. The patient describes the symptoms as a sharp pain in her lower left quadrant. Associated symptoms include nausea without vomiting, diarrhea, and episodes of blood when wiping. The patient reports no fevers, urinary symptoms, no vaginal bleeding or discharge. Patient states that she ended her menstrual cycle on Sunday.  Patient was seen on Monday at the emergency department for the same abdominal pain and found to have epiploic appendagitis, and patient states that the abdominal pain has gotten worse since being seen.  No recent travel. No sick contacts. The patient's social history is notable for current smoker.     Abdominal Pain      Prior to Admission medications   Medication Sig Start Date End Date Taking? Authorizing Provider  ibuprofen  (ADVIL ) 800 MG tablet Take 1 tablet (800 mg total) by mouth 3 (three) times daily for 14 days. 09/09/24 09/23/24 Yes Broxton Broady L, PA  albuterol  (VENTOLIN  HFA) 108 (90 Base) MCG/ACT inhaler Inhale 1-2 puffs into the lungs every 6 (six) hours as needed for wheezing or shortness of breath. 02/19/23   Palumbo, April, MD  doxycycline  (VIBRAMYCIN ) 100 MG capsule Take 1 capsule (100 mg total) by mouth 2 (two) times daily. 12/24/23   Haze Lonni PARAS, MD  megestrol  (MEGACE ) 40 MG tablet TAKE 1 TABLET(40 MG) BY MOUTH TWICE DAILY 06/21/19   Dove, Myra C, MD  megestrol  (MEGACE ) 40 MG tablet TAKE 1 TABLET(40 MG) BY MOUTH TWICE DAILY 06/21/19   Dove, Myra C, MD  norgestrel -ethinyl estradiol  (LO/OVRAL ,CRYSELLE ) 0.3-30 MG-MCG tablet Take 1 tablet by mouth daily. Patient not taking:  Reported on 05/29/2018 04/30/18   Dove, Myra C, MD  ondansetron  (ZOFRAN -ODT) 4 MG disintegrating tablet Take 1 tablet (4 mg total) by mouth every 8 (eight) hours as needed. 09/06/24   Henderly, Britni A, PA-C  oxyCODONE-acetaminophen (PERCOCET/ROXICET) 5-325 MG tablet Take 1 tablet by mouth every 6 (six) hours as needed for severe pain (pain score 7-10). 09/06/24   Henderly, Britni A, PA-C  predniSONE  (DELTASONE ) 10 MG tablet Take 2 tablets (20 mg total) by mouth daily. 02/19/23   Palumbo, April, MD    Allergies: Patient has no known allergies.    Review of Systems  Gastrointestinal:  Positive for abdominal pain.   Updated Vital Signs BP (!) 132/92 (BP Location: Left Arm)   Pulse 75   Temp 97.9 F (36.6 C) (Oral)   Resp 18   Ht 5' 5 (1.651 m)   Wt 125.2 kg   LMP 09/01/2024 Comment: negatve HCG 09/08/24  SpO2 93%   BMI 45.93 kg/m   Physical Exam Vitals and nursing note reviewed.  Constitutional:      Appearance: Normal appearance. She is not toxic-appearing.  HENT:     Head: Normocephalic and atraumatic.  Eyes:     Extraocular Movements: Extraocular movements intact.     Conjunctiva/sclera: Conjunctivae normal.     Pupils: Pupils are equal, round, and reactive to light.  Cardiovascular:     Rate and Rhythm: Normal rate and regular rhythm.     Pulses: Normal pulses.  Pulmonary:     Effort: Pulmonary effort is  normal. No respiratory distress.  Abdominal:     General: Abdomen is flat.     Palpations: Abdomen is soft.     Tenderness: There is generalized abdominal tenderness. There is no right CVA tenderness or left CVA tenderness.     Hernia: No hernia is present.     Comments: Tenderness on palpation to the entire abdomen, however patient stated she feels a sharp pain in the lower left quadrant. No guarding, no rebound.  No distention.  No ecchymosis.  Genitourinary:    Rectum: Normal. Guaiac result negative. No tenderness, external hemorrhoid or internal hemorrhoid.   Musculoskeletal:        General: Normal range of motion.     Cervical back: Normal range of motion.  Skin:    General: Skin is warm and dry.     Capillary Refill: Capillary refill takes less than 2 seconds.  Neurological:     General: No focal deficit present.     Mental Status: She is alert. Mental status is at baseline.  Psychiatric:        Mood and Affect: Mood normal.    (all labs ordered are listed, but only abnormal results are displayed) Labs Reviewed  COMPREHENSIVE METABOLIC PANEL WITH GFR - Abnormal; Notable for the following components:      Result Value   Glucose, Bld 115 (*)    All other components within normal limits  CBC - Abnormal; Notable for the following components:   WBC 11.4 (*)    All other components within normal limits  URINALYSIS, ROUTINE W REFLEX MICROSCOPIC - Abnormal; Notable for the following components:   Specific Gravity, Urine 1.031 (*)    All other components within normal limits  LIPASE, BLOOD  HCG, SERUM, QUALITATIVE  POC OCCULT BLOOD, ED    EKG: None  Radiology: CT ABDOMEN PELVIS W CONTRAST Result Date: 09/09/2024 EXAM: CT ABDOMEN AND PELVIS WITH CONTRAST 09/09/2024 12:50:29 AM TECHNIQUE: CT of the abdomen and pelvis was performed with the administration of 100 mL of iohexol (OMNIPAQUE) 300 MG/ML solution. Multiplanar reformatted images are provided for review. Automated exposure control, iterative reconstruction, and/or weight-based adjustment of the mA/kV was utilized to reduce the radiation dose to as low as reasonably achievable. COMPARISON: CT abdomen and pelvis 09/06/2024. CLINICAL HISTORY: Abdominal pain, acute, nonlocalized; RLQ abdominal pain. LLQ pain constantly since Saturday with nausea, wbc's 11.4, GFR>60, negative HCG 09/08/24, prev ct a/p 09/06/24; 100 ml omni 300. FINDINGS: LOWER CHEST: Mosaic attenuation of the visualized lower lungs. LIVER: The liver is unremarkable. GALLBLADDER AND BILE DUCTS: Gallbladder is unremarkable.  No biliary ductal dilatation. SPLEEN: No acute abnormality. PANCREAS: No acute abnormality. ADRENAL GLANDS: No acute abnormality. KIDNEYS, URETERS AND BLADDER: No stones in the kidneys or ureters. No hydronephrosis. No perinephric or periureteral stranding. Urinary bladder is unremarkable. GI AND BOWEL: Stomach demonstrates no acute abnormality. No small or large bowel wall thickening or dilatation. The appendix is normal. There is fat stranding along the distal descending colon / proximal sigmoid colon. Associated 3 x 1.3 cm inflamed lobular fat with peripheral hyperdensity. PERITONEUM AND RETROPERITONEUM: No ascites. No free air. VASCULATURE: Aorta is normal in caliber. LYMPH NODES: No lymphadenopathy. REPRODUCTIVE ORGANS: The uterus is unremarkable. Bilateral ovaries are unremarkable. No adnexal mass. BONES AND SOFT TISSUES: No acute osseous abnormality. No focal soft tissue abnormality. IMPRESSION: 1. Findings compatible with epiploic appendagitis at the distal descending colon/proximal sigmoid colon region. Electronically signed by: Morgane Naveau MD 09/09/2024 12:54 AM EDT RP Workstation: [id number]  Procedures   Medications Ordered in the ED  iohexol (OMNIPAQUE) 300 MG/ML solution 100 mL (100 mLs Intravenous Contrast Given 09/09/24 0036)  ketorolac  (TORADOL ) 30 MG/ML injection 30 mg (30 mg Intravenous Given 09/09/24 0108)                                    Medical Decision Making Amount and/or Complexity of Data Reviewed Labs: ordered. Radiology: ordered.  Risk Prescription drug management.   Patient presents to the ED for concern of abdominal pain, this involves an extensive number of treatment options, and is a complaint that carries with it a high risk of complications and morbidity.    The differential diagnosis includes: Infectious - appendicitis  Obstruction GYN/Reproductive  Trauma  Co morbidities that complicate the patient evaluation: Obesity  Additional history  obtained: Additional history obtained from Outside Medical Records and Past Admission  External records from outside source obtained and reviewed including medical history, surgical history, allergies, medications. The patient was a reliable historian, providing a clear, detailed, and consistent account of the presenting symptoms and relevant medical history. The information was obtained directly from the patient and statements were documented in the patient's own words when possible. No discrepancies were noted between the history provided and available collateral sources.     Lab Tests: I ordered, and personally interpreted labs.   The pertinent results include:  CMP WNL CBC slight leukocytosis Lipase WNL HcG negative UA negative POC occult blood - negative    Imaging Studies ordered: I ordered imaging studies including: CT abdomen pelvis with contrast I independently visualized and interpreted imaging which showed: Findings compatible with epiploic appendagitis at the distal descending colon/proximal sigmoid colon region. I agree with the radiologist interpretation  Medicines ordered and prescription drug management: I ordered medications: Toradol  30 mg for pain Reevaluation of the patient after these medicines showed that the patient improved.  I have reviewed the patients home medicines and have made adjustments as needed  Problem List / ED Course: Problem List: Abdominal pain Emergency Department Course: The patient presented with abdominal pain. Initial assessment included history, physical exam, and review of prior medical records.  CMP, CBC completed for infectious and metabolic etiologies. CBC showed slight leukocytosis. Lipase normal. Hcg Negative. UA negative. Occult blood completed due to patient stated that she was noticing blood on tissue after wiping - occult negative. Patient denies any vaginal bleeding or discharge - making gyn etiology unlikely. CT imaging was  completed per patient request/shared decision making - CT showed findings compatible with epiploic appendagitis which is consistent with previous CT she had on Monday.  Given patient history, physical exam findings, laboratory studies, imaging plan to discharge patient with antiinflammatories and close follow up with GI at her appointment next week for further regarding the epiploic appendagitis.   Dispostion: After consideration of the diagnostic results and the patients physical exam findings, I feel that the patent would benefit from discharge home with anti-inflammatories, supportive care measures, and follow-up with her GI next week at her scheduled appointment for further evaluation and care of the epiploic appendagitis causing abdominal pain Clinical Assessment:    Working diagnosis: abdominal pain Disposition Plan: The patient is medically stable for discharge from the Emergency Department at this time. Vital signs are within normal limits, and the patient is alert, oriented, and in no acute distress. Diagnostic evaluation has been completed with no findings necessitating hospital admission or  further emergent workup.  Communication:   Patient informed of disposition decision and rationale. Questions addressed.  The diagnostic results and clinical impression were discussed with the patient at bedside and the patient demonstrated understanding.        Final diagnoses:  Abdominal pain, unspecified abdominal location  Epiploic appendagitis    ED Discharge Orders          Ordered    ibuprofen  (ADVIL ) 800 MG tablet  3 times daily        09/09/24 0135               Willma Duwaine CROME, [place] 09/09/24 [id number]    Raford Lenis, MD 09/09/24 [phone]

## 2024-09-08 NOTE — ED Triage Notes (Signed)
 Pt reports LLQ pain constantly since Saturday. Pt reports nausea and last BM this morning. Pt was seen and CT done on 10/27

## 2024-09-08 NOTE — ED Provider Notes (Incomplete)
 [place] EMERGENCY DEPARTMENT AT [facility] Provider Note   CSN: [id number] Arrival date & time: 09/08/24  2211     Patient presents with: Abdominal Pain   Laura Lynch is a 28 y.o. female with a history of epiploic appendagitis presents to the ED with *** that began ***. The symptoms started *** and have been progressively *** since onset. The patient describes the symptoms as ***, located at ***, and rates the severity as ***. Associated symptoms include ***. The patient reports no ***. There is *** of prior episodes or relevant medical history, including ***. No recent travel. No sick contacts. The patient's social history is notable for ***.    {Add pertinent medical, surgical, social history, OB history to HPI:32947}  Abdominal Pain      Prior to Admission medications   Medication Sig Start Date End Date Taking? Authorizing Provider  albuterol  (VENTOLIN  HFA) 108 (90 Base) MCG/ACT inhaler Inhale 1-2 puffs into the lungs every 6 (six) hours as needed for wheezing or shortness of breath. 02/19/23   Palumbo, April, MD  doxycycline  (VIBRAMYCIN ) 100 MG capsule Take 1 capsule (100 mg total) by mouth 2 (two) times daily. 12/24/23   Haze Lonni PARAS, MD  ibuprofen  (ADVIL ) 800 MG tablet Take 1 tablet (800 mg total) by mouth 3 (three) times daily. 09/12/22   Adolph Tinnie FORBES, PA-C  ibuprofen  (ADVIL ,MOTRIN ) 800 MG tablet Take 1 tablet (800 mg total) by mouth 3 (three) times daily. 05/30/18   Muthersbaugh, Chiquita, PA-C  megestrol  (MEGACE ) 40 MG tablet TAKE 1 TABLET(40 MG) BY MOUTH TWICE DAILY 06/21/19   Dove, Myra C, MD  megestrol  (MEGACE ) 40 MG tablet TAKE 1 TABLET(40 MG) BY MOUTH TWICE DAILY 06/21/19   Dove, Myra C, MD  norgestrel -ethinyl estradiol  (LO/OVRAL ,CRYSELLE ) 0.3-30 MG-MCG tablet Take 1 tablet by mouth daily. Patient not taking: Reported on 05/29/2018 04/30/18   Dove, Myra C, MD  ondansetron  (ZOFRAN -ODT) 4 MG disintegrating tablet Take 1 tablet (4 mg total) by mouth  every 8 (eight) hours as needed. 09/06/24   Henderly, Britni A, PA-C  oxyCODONE-acetaminophen (PERCOCET/ROXICET) 5-325 MG tablet Take 1 tablet by mouth every 6 (six) hours as needed for severe pain (pain score 7-10). 09/06/24   Henderly, Britni A, PA-C  predniSONE  (DELTASONE ) 10 MG tablet Take 2 tablets (20 mg total) by mouth daily. 02/19/23   Palumbo, April, MD    Allergies: Patient has no known allergies.    Review of Systems  Gastrointestinal:  Positive for abdominal pain.    Updated Vital Signs BP (!) 142/97 (BP Location: Left Arm) Comment: Simultaneous filing. User may not have seen previous data.  Pulse 84   Temp 98.2 F (36.8 C) (Oral)   Resp 17 Comment: Simultaneous filing. User may not have seen previous data.  Ht 5' 5 (1.651 m)   Wt 125.2 kg   LMP 09/01/2024   SpO2 97%   BMI 45.93 kg/m   Physical Exam  (all labs ordered are listed, but only abnormal results are displayed) Labs Reviewed  COMPREHENSIVE METABOLIC PANEL WITH GFR - Abnormal; Notable for the following components:      Result Value   Glucose, Bld 115 (*)    All other components within normal limits  CBC - Abnormal; Notable for the following components:   WBC 11.4 (*)    All other components within normal limits  URINALYSIS, ROUTINE W REFLEX MICROSCOPIC - Abnormal; Notable for the following components:   Specific Gravity, Urine 1.031 (*)  All other components within normal limits  LIPASE, BLOOD  HCG, SERUM, QUALITATIVE    EKG: None  Radiology: No results found.  {Document cardiac monitor, telemetry assessment procedure when appropriate:32947} Procedures   Medications Ordered in the ED - No data to display    {Click here for ABCD2, HEART and other calculators REFRESH Note before signing:1}                              Medical Decision Making Amount and/or Complexity of Data Reviewed Labs: ordered.   ***  {Document critical care time when appropriate  Document review of labs and  clinical decision tools ie CHADS2VASC2, etc  Document your independent review of radiology images and any outside records  Document your discussion with family members, caretakers and with consultants  Document social determinants of health affecting pt's care  Document your decision making why or why not admission, treatments were needed:32947:::1}   Final diagnoses:  None    ED Discharge Orders     None

## 2024-09-09 ENCOUNTER — Emergency Department (HOSPITAL_COMMUNITY)

## 2024-09-09 ENCOUNTER — Encounter (HOSPITAL_COMMUNITY): Payer: Self-pay

## 2024-09-09 LAB — POC OCCULT BLOOD, ED: Fecal Occult Bld: NEGATIVE

## 2024-09-09 MED ORDER — IOHEXOL 300 MG/ML  SOLN
100.0000 mL | Freq: Once | INTRAMUSCULAR | Status: AC | PRN
  Administered 2024-09-09: 100 mL via INTRAVENOUS

## 2024-09-09 MED ORDER — IBUPROFEN 800 MG PO TABS: 800.0000 mg | ORAL_TABLET | Freq: Three times a day (TID) | ORAL | 0 refills | Status: AC

## 2024-09-09 MED ORDER — KETOROLAC TROMETHAMINE 30 MG/ML IJ SOLN
30.0000 mg | Freq: Once | INTRAMUSCULAR | Status: AC
  Administered 2024-09-09: 30 mg via INTRAVENOUS
  Filled 2024-09-09: qty 1

## 2024-09-09 NOTE — Discharge Instructions (Addendum)
 Thank you for visiting the Emergency Department today. It was a pleasure to be part of your healthcare team.  Your test results showed no acute findings.  You have been treated with anti-inflammatories, and you should take your medications as directed. If you have any questions about your medicines, please call your pharmacy or healthcare provider. At home, rest, hydrate, resume normal diet. It is important to watch for warning signs such as worsening pain, fever, trouble breathing, pain. If any of these happen, return to the Emergency Department or call 911. Thank you for trusting us  with your health.
